# Patient Record
Sex: Female | Born: 1975 | Race: White | Hispanic: No | Marital: Married | Smoking: Current every day smoker
Health system: Southern US, Community
[De-identification: ages and names within clinical notes are randomized; demographics above are authoritative.]

## PROBLEM LIST (undated history)

## (undated) DIAGNOSIS — N390 Urinary tract infection, site not specified: Secondary | ICD-10-CM

## (undated) DIAGNOSIS — K649 Unspecified hemorrhoids: Secondary | ICD-10-CM

## (undated) DIAGNOSIS — F32A Depression, unspecified: Secondary | ICD-10-CM

## (undated) DIAGNOSIS — F329 Major depressive disorder, single episode, unspecified: Secondary | ICD-10-CM

## (undated) DIAGNOSIS — F419 Anxiety disorder, unspecified: Secondary | ICD-10-CM

## (undated) HISTORY — PX: TUBAL LIGATION: SHX77

## (undated) SURGERY — Surgical Case
Anesthesia: *Unknown

---

## 2003-11-29 ENCOUNTER — Emergency Department: Payer: Self-pay | Admitting: Emergency Medicine

## 2004-05-30 ENCOUNTER — Emergency Department: Payer: Self-pay | Admitting: Emergency Medicine

## 2004-08-14 ENCOUNTER — Emergency Department: Payer: Self-pay | Admitting: Emergency Medicine

## 2005-05-28 ENCOUNTER — Emergency Department: Payer: Self-pay | Admitting: Emergency Medicine

## 2005-07-06 ENCOUNTER — Emergency Department: Payer: Self-pay | Admitting: Unknown Physician Specialty

## 2005-12-15 ENCOUNTER — Emergency Department: Payer: Self-pay | Admitting: Internal Medicine

## 2006-11-06 ENCOUNTER — Encounter: Payer: Self-pay | Admitting: Maternal & Fetal Medicine

## 2006-11-17 ENCOUNTER — Encounter: Payer: Self-pay | Admitting: Maternal & Fetal Medicine

## 2007-01-22 HISTORY — PX: TUBAL LIGATION: SHX77

## 2007-03-10 ENCOUNTER — Observation Stay: Payer: Self-pay

## 2007-03-12 ENCOUNTER — Observation Stay: Payer: Self-pay

## 2007-03-15 ENCOUNTER — Observation Stay: Payer: Self-pay

## 2007-03-18 ENCOUNTER — Observation Stay: Payer: Self-pay

## 2007-03-20 ENCOUNTER — Ambulatory Visit: Payer: Self-pay | Admitting: Family Medicine

## 2007-03-27 ENCOUNTER — Inpatient Hospital Stay: Payer: Self-pay | Admitting: Certified Nurse Midwife

## 2007-05-15 ENCOUNTER — Ambulatory Visit: Payer: Self-pay | Admitting: Obstetrics and Gynecology

## 2007-05-18 ENCOUNTER — Ambulatory Visit: Payer: Self-pay | Admitting: Obstetrics and Gynecology

## 2007-05-27 ENCOUNTER — Emergency Department: Payer: Self-pay | Admitting: Emergency Medicine

## 2007-05-29 ENCOUNTER — Emergency Department: Payer: Self-pay | Admitting: Emergency Medicine

## 2007-06-30 ENCOUNTER — Emergency Department: Payer: Self-pay | Admitting: Emergency Medicine

## 2009-09-30 ENCOUNTER — Emergency Department: Payer: Self-pay | Admitting: Emergency Medicine

## 2010-04-24 DIAGNOSIS — F329 Major depressive disorder, single episode, unspecified: Secondary | ICD-10-CM | POA: Insufficient documentation

## 2010-07-16 ENCOUNTER — Emergency Department: Payer: Self-pay | Admitting: Emergency Medicine

## 2010-09-29 ENCOUNTER — Emergency Department: Payer: Self-pay | Admitting: Internal Medicine

## 2012-03-15 ENCOUNTER — Emergency Department: Payer: Self-pay | Admitting: Emergency Medicine

## 2012-03-16 LAB — URINALYSIS, COMPLETE
Ketone: NEGATIVE
Specific Gravity: 1.027 (ref 1.003–1.030)
WBC UR: 63 /HPF (ref 0–5)

## 2012-03-16 LAB — CBC
HCT: 37.4 % (ref 35.0–47.0)
HGB: 12.7 g/dL (ref 12.0–16.0)
MCH: 27.4 pg (ref 26.0–34.0)
MCHC: 33.9 g/dL (ref 32.0–36.0)
RBC: 4.63 10*6/uL (ref 3.80–5.20)
RDW: 16.3 % — ABNORMAL HIGH (ref 11.5–14.5)
WBC: 6.3 10*3/uL (ref 3.6–11.0)

## 2012-03-16 LAB — BASIC METABOLIC PANEL
BUN: 17 mg/dL (ref 7–18)
Chloride: 106 mmol/L (ref 98–107)
Glucose: 85 mg/dL (ref 65–99)
Osmolality: 275 (ref 275–301)
Potassium: 3.7 mmol/L (ref 3.5–5.1)
Sodium: 137 mmol/L (ref 136–145)

## 2012-05-16 ENCOUNTER — Emergency Department: Payer: Self-pay | Admitting: Emergency Medicine

## 2012-05-16 LAB — URINALYSIS, COMPLETE
Bilirubin,UR: NEGATIVE
Glucose,UR: NEGATIVE mg/dL (ref 0–75)
RBC,UR: 5 /HPF (ref 0–5)

## 2012-10-12 ENCOUNTER — Emergency Department: Payer: Self-pay | Admitting: Emergency Medicine

## 2012-10-12 LAB — URINALYSIS, COMPLETE
Bacteria: NONE SEEN
Bilirubin,UR: NEGATIVE
Glucose,UR: NEGATIVE mg/dL (ref 0–75)
Nitrite: NEGATIVE
Ph: 5 (ref 4.5–8.0)
Protein: NEGATIVE
RBC,UR: 9 /HPF (ref 0–5)
Specific Gravity: 1.014 (ref 1.003–1.030)
Transitional Epi: <1
WBC UR: 81 /HPF (ref 0–5)

## 2012-10-13 ENCOUNTER — Emergency Department: Payer: Self-pay | Admitting: Emergency Medicine

## 2012-10-13 LAB — URINALYSIS, COMPLETE
Bilirubin,UR: NEGATIVE
Glucose,UR: NEGATIVE mg/dL (ref 0–75)
Nitrite: NEGATIVE
Ph: 5 (ref 4.5–8.0)
Protein: 30
RBC,UR: 86 /HPF (ref 0–5)
Specific Gravity: 1.023 (ref 1.003–1.030)
Squamous Epithelial: 11

## 2012-10-13 LAB — COMPREHENSIVE METABOLIC PANEL
Bilirubin,Total: 0.2 mg/dL (ref 0.2–1.0)
Calcium, Total: 8.7 mg/dL (ref 8.5–10.1)
Chloride: 107 mmol/L (ref 98–107)
Co2: 25 mmol/L (ref 21–32)
Creatinine: 0.83 mg/dL (ref 0.60–1.30)
EGFR (Non-African Amer.): >60
Glucose: 122 mg/dL — ABNORMAL HIGH (ref 65–99)
SGOT(AST): 15 U/L (ref 15–37)
SGPT (ALT): 18 U/L (ref 12–78)
Sodium: 137 mmol/L (ref 136–145)

## 2012-10-13 LAB — CBC
HGB: 13.6 g/dL (ref 12.0–16.0)
Platelet: 186 10*3/uL (ref 150–440)
RBC: 4.81 10*6/uL (ref 3.80–5.20)
RDW: 16.3 % — ABNORMAL HIGH (ref 11.5–14.5)
WBC: 7.8 10*3/uL (ref 3.6–11.0)

## 2013-05-07 ENCOUNTER — Emergency Department: Payer: Self-pay | Admitting: Emergency Medicine

## 2013-05-07 LAB — URINALYSIS, COMPLETE
BLOOD: NEGATIVE
Bilirubin,UR: NEGATIVE
Glucose,UR: NEGATIVE mg/dL (ref 0–75)
Ketone: NEGATIVE
NITRITE: NEGATIVE
Ph: 7 (ref 4.5–8.0)
Protein: NEGATIVE
RBC, UR: NONE SEEN /HPF (ref 0–5)
SPECIFIC GRAVITY: 1.015 (ref 1.003–1.030)
WBC UR: 42 /HPF (ref 0–5)

## 2013-05-11 LAB — URINE CULTURE

## 2013-06-19 ENCOUNTER — Emergency Department: Payer: Self-pay | Admitting: Emergency Medicine

## 2013-06-19 LAB — CBC WITH DIFFERENTIAL/PLATELET
BASOS ABS: 0.1 10*3/uL (ref 0.0–0.1)
BASOS PCT: 0.8 %
EOS ABS: 0.2 10*3/uL (ref 0.0–0.7)
Eosinophil %: 1.9 %
HCT: 37.2 % (ref 35.0–47.0)
HGB: 12.3 g/dL (ref 12.0–16.0)
LYMPHS ABS: 1.5 10*3/uL (ref 1.0–3.6)
Lymphocyte %: 17.4 %
MCH: 27.4 pg (ref 26.0–34.0)
MCHC: 33 g/dL (ref 32.0–36.0)
MCV: 83 fL (ref 80–100)
MONO ABS: 0.8 x10 3/mm (ref 0.2–0.9)
MONOS PCT: 8.9 %
NEUTROS PCT: 71 %
Neutrophil #: 6.2 10*3/uL (ref 1.4–6.5)
Platelet: 169 10*3/uL (ref 150–440)
RBC: 4.48 10*6/uL (ref 3.80–5.20)
RDW: 16 % — ABNORMAL HIGH (ref 11.5–14.5)
WBC: 8.8 10*3/uL (ref 3.6–11.0)

## 2013-06-19 LAB — URINALYSIS, COMPLETE
BLOOD: NEGATIVE
Bacteria: NONE SEEN
Bilirubin,UR: NEGATIVE
GLUCOSE, UR: NEGATIVE mg/dL (ref 0–75)
Ketone: NEGATIVE
Nitrite: NEGATIVE
PH: 5 (ref 4.5–8.0)
Protein: NEGATIVE
RBC,UR: <1 /HPF (ref 0–5)
Specific Gravity: 1.021 (ref 1.003–1.030)
Squamous Epithelial: 5
WBC UR: 2 /HPF (ref 0–5)

## 2013-06-19 LAB — COMPREHENSIVE METABOLIC PANEL
ALBUMIN: 3.2 g/dL — AB (ref 3.4–5.0)
ALK PHOS: 71 U/L
ALT: 17 U/L (ref 12–78)
ANION GAP: 9 (ref 7–16)
AST: 8 U/L — AB (ref 15–37)
BUN: 11 mg/dL (ref 7–18)
Bilirubin,Total: 0.2 mg/dL (ref 0.2–1.0)
CHLORIDE: 106 mmol/L (ref 98–107)
CREATININE: 0.59 mg/dL — AB (ref 0.60–1.30)
Calcium, Total: 8.6 mg/dL (ref 8.5–10.1)
Co2: 22 mmol/L (ref 21–32)
EGFR (African American): >60
GLUCOSE: 108 mg/dL — AB (ref 65–99)
OSMOLALITY: 274 (ref 275–301)
Potassium: 3.7 mmol/L (ref 3.5–5.1)
Sodium: 137 mmol/L (ref 136–145)
Total Protein: 6.8 g/dL (ref 6.4–8.2)

## 2013-06-19 LAB — LIPASE, BLOOD: Lipase: 149 U/L (ref 73–393)

## 2013-06-19 LAB — TROPONIN I

## 2013-07-17 ENCOUNTER — Emergency Department: Payer: Self-pay | Admitting: Emergency Medicine

## 2013-07-17 LAB — URINALYSIS, COMPLETE
Bacteria: NONE SEEN
Bilirubin,UR: NEGATIVE
Glucose,UR: NEGATIVE mg/dL (ref 0–75)
Ketone: NEGATIVE
Nitrite: NEGATIVE
Ph: 5 (ref 4.5–8.0)
Protein: 30
RBC,UR: 180 /HPF (ref 0–5)
Specific Gravity: 1.025 (ref 1.003–1.030)
Squamous Epithelial: 4
WBC UR: 1037 /HPF (ref 0–5)

## 2013-07-22 DIAGNOSIS — N390 Urinary tract infection, site not specified: Secondary | ICD-10-CM | POA: Insufficient documentation

## 2014-02-15 ENCOUNTER — Emergency Department: Payer: Self-pay | Admitting: Emergency Medicine

## 2014-02-16 LAB — CBC
HCT: 39.3 % (ref 35.0–47.0)
HGB: 12.9 g/dL (ref 12.0–16.0)
MCH: 26.9 pg (ref 26.0–34.0)
MCHC: 32.9 g/dL (ref 32.0–36.0)
MCV: 82 fL (ref 80–100)
Platelet: 228 10*3/uL (ref 150–440)
RBC: 4.79 10*6/uL (ref 3.80–5.20)
RDW: 15.9 % — ABNORMAL HIGH (ref 11.5–14.5)
WBC: 7.3 10*3/uL (ref 3.6–11.0)

## 2014-02-16 LAB — HCG, QUANTITATIVE, PREGNANCY

## 2014-02-16 LAB — GC/CHLAMYDIA PROBE AMP

## 2014-02-16 LAB — WET PREP, GENITAL

## 2014-03-20 ENCOUNTER — Emergency Department: Payer: Self-pay | Admitting: Internal Medicine

## 2014-05-02 ENCOUNTER — Emergency Department: Admit: 2014-05-02 | Disposition: A | Discharge status: Home / Self Care | Payer: Self-pay | Admitting: Student

## 2014-05-02 LAB — URINALYSIS, COMPLETE
BACTERIA: NONE SEEN
BILIRUBIN, UR: NEGATIVE
BLOOD: NEGATIVE
Glucose,UR: NEGATIVE mg/dL (ref 0–75)
Ketone: NEGATIVE
NITRITE: NEGATIVE
PH: 5 (ref 4.5–8.0)
Protein: NEGATIVE
Specific Gravity: 1.025 (ref 1.003–1.030)

## 2014-05-02 LAB — GC/CHLAMYDIA PROBE AMP

## 2014-05-02 LAB — WET PREP, GENITAL

## 2014-05-04 LAB — URINE CULTURE

## 2014-06-10 ENCOUNTER — Encounter: Payer: Self-pay | Admitting: Emergency Medicine

## 2014-06-10 ENCOUNTER — Emergency Department
Admission: EM | Admit: 2014-06-10 | Discharge: 2014-06-10 | Disposition: A | Discharge status: Home / Self Care | Payer: BLUE CROSS/BLUE SHIELD | Attending: Emergency Medicine | Admitting: Emergency Medicine

## 2014-06-10 DIAGNOSIS — Z720 Tobacco use: Secondary | ICD-10-CM | POA: Diagnosis not present

## 2014-06-10 DIAGNOSIS — Z79899 Other long term (current) drug therapy: Secondary | ICD-10-CM | POA: Diagnosis not present

## 2014-06-10 DIAGNOSIS — K625 Hemorrhage of anus and rectum: Secondary | ICD-10-CM | POA: Insufficient documentation

## 2014-06-10 DIAGNOSIS — R1030 Lower abdominal pain, unspecified: Secondary | ICD-10-CM

## 2014-06-10 HISTORY — DX: Anxiety disorder, unspecified: F41.9

## 2014-06-10 HISTORY — DX: Unspecified hemorrhoids: K64.9

## 2014-06-10 HISTORY — DX: Major depressive disorder, single episode, unspecified: F32.9

## 2014-06-10 HISTORY — DX: Depression, unspecified: F32.A

## 2014-06-10 LAB — COMPREHENSIVE METABOLIC PANEL
ALT: 15 U/L (ref 14–54)
AST: 15 U/L (ref 15–41)
Albumin: 3.8 g/dL (ref 3.5–5.0)
Alkaline Phosphatase: 57 U/L (ref 38–126)
Anion gap: 8 (ref 5–15)
BUN: 11 mg/dL (ref 6–20)
CALCIUM: 8.7 mg/dL — AB (ref 8.9–10.3)
CHLORIDE: 102 mmol/L (ref 101–111)
CO2: 26 mmol/L (ref 22–32)
Creatinine, Ser: 0.63 mg/dL (ref 0.44–1.00)
GFR calc non Af Amer: >60 mL/min (ref >60)
Glucose, Bld: 90 mg/dL (ref 65–99)
POTASSIUM: 3.8 mmol/L (ref 3.5–5.1)
Sodium: 136 mmol/L (ref 135–145)
TOTAL PROTEIN: 7 g/dL (ref 6.5–8.1)
Total Bilirubin: 0.1 mg/dL — ABNORMAL LOW (ref 0.3–1.2)

## 2014-06-10 LAB — CBC
HCT: 37.7 % (ref 35.0–47.0)
Hemoglobin: 12.8 g/dL (ref 12.0–16.0)
MCH: 27.6 pg (ref 26.0–34.0)
MCHC: 34.1 g/dL (ref 32.0–36.0)
MCV: 80.9 fL (ref 80.0–100.0)
Platelets: 186 10*3/uL (ref 150–440)
RBC: 4.65 MIL/uL (ref 3.80–5.20)
RDW: 16.5 % — ABNORMAL HIGH (ref 11.5–14.5)
WBC: 8 10*3/uL (ref 3.6–11.0)

## 2014-06-10 MED ORDER — METRONIDAZOLE 500 MG PO TABS: 500.0000 mg | ORAL_TABLET | Freq: Two times a day (BID) | ORAL | Status: AC

## 2014-06-10 MED ORDER — METRONIDAZOLE 500 MG PO TABS
500.0000 mg | ORAL_TABLET | Freq: Once | ORAL | Status: AC
  Administered 2014-06-10: 500 mg via ORAL

## 2014-06-10 MED ORDER — METRONIDAZOLE 500 MG PO TABS
ORAL_TABLET | ORAL | Status: DC
Start: 2014-06-10 — End: 2014-06-11
  Filled 2014-06-10: qty 1

## 2014-06-10 MED ORDER — CIPROFLOXACIN HCL 250 MG PO TABS: 250.0000 mg | ORAL_TABLET | Freq: Two times a day (BID) | ORAL | Status: AC

## 2014-06-10 MED ORDER — CIPROFLOXACIN HCL 500 MG PO TABS
ORAL_TABLET | ORAL | Status: DC
Start: 2014-06-10 — End: 2014-06-11
  Filled 2014-06-10: qty 1

## 2014-06-10 MED ORDER — CIPROFLOXACIN HCL 500 MG PO TABS
250.0000 mg | ORAL_TABLET | Freq: Once | ORAL | Status: AC
Start: 2014-06-10 — End: 2014-06-10
  Administered 2014-06-10: 250 mg via ORAL

## 2014-06-10 NOTE — Discharge Instructions (Signed)
Take metronidazole and Cipro for a possible diverticulitis. The agreed not to do any CT or imaging for this with your current symptoms and white blood cell count. Follow-up with her regular doctor. Return to the emergency department if your symptoms worse or you have uncontrolled pain.  Abdominal Pain Many things can cause belly (abdominal) pain. Most times, the belly pain is not dangerous. Many cases of belly pain can be watched and treated at home. HOME CARE   Do not take medicines that help you go poop (laxatives) unless told to by your doctor.  Only take medicine as told by your doctor.  Eat or drink as told by your doctor. Your doctor will tell you if you should be on a special diet. GET HELP IF:  You do not know what is causing your belly pain.  You have belly pain while you are sick to your stomach (nauseous) or have runny poop (diarrhea).  You have pain while you pee or poop.  Your belly pain wakes you up at night.  You have belly pain that gets worse or better when you eat.  You have belly pain that gets worse when you eat fatty foods.  You have a fever. GET HELP RIGHT AWAY IF:   The pain does not go away within 2 hours.  You keep throwing up (vomiting).  The pain changes and is only in the right or left part of the belly.  You have bloody or tarry looking poop. MAKE SURE YOU:   Understand these instructions.  Will watch your condition.  Will get help right away if you are not doing well or get worse. Document Released: 06/26/2007 Document Revised: 01/12/2013 Document Reviewed: 09/16/2012 [facility] Patient Information 2015 [employer] [place]. This information is not intended to replace advice given to you by your health care provider. Make sure you discuss any questions you have with your health care provider.

## 2014-06-10 NOTE — ED Notes (Signed)
Patient states she had BM on Wednesday and noticed blood in toilet (more than just blood on paper). Patient states she had BM again today with the same. Has h/o hemorrhoids. +Pain with Wednesday BM, no pain today.

## 2014-06-10 NOTE — ED Notes (Signed)
Patient states she had diarrhea, nausea and vomiting Wednesday night which has now resolved. Patient states she saw blood in her stool at that time. Patient reports that today she had a regular, soft BM and noticed blood today also.

## 2014-06-10 NOTE — ED Provider Notes (Signed)
[facility] Emergency Department Provider Note  ____________________________________________  Time seen: 2035  I have reviewed the triage vital signs and the nursing notes.   HISTORY  Chief Complaint Rectal Bleeding  Abdominal pain   HPI Debra Wang is a 39 y.o. female who reports she has had some diarrhea this week. On Wednesday she had a bowel movement that had some blood present. There was blood in the toilet and little bit on the toilet paper. She had no pain at that time. She has taken some Pepto-Bismol to treat the diarrhea. Her stool has firmed up some. She says she had a normal bowel movement today but again had blood present. She reports she has had some pain in her left lower quadrant. She does have a history of hemorrhoids. With hemorrhoids in the past she had had rectal pain which she does not have currently. She denies any fever. She has had a little bit of nausea earlier in the week but no vomiting.  Past Medical History  Diagnosis Date  . Anxiety   . Depression   . Hemorrhoid     There are no active problems to display for this patient.   Past Surgical History  Procedure Laterality Date  . Tubal ligation  2009    Current Outpatient Rx  Name  Route  Sig  Dispense  Refill  . citalopram (CELEXA) 40 MG tablet   Oral   Take 40 mg by mouth daily.         . clonazePAM (KLONOPIN) 0.5 MG tablet   Oral   Take 0.5 mg by mouth at bedtime.         . traZODone (DESYREL) 50 MG tablet   Oral   Take 50 mg by mouth at bedtime. prn         . ciprofloxacin (CIPRO) 250 MG tablet   Oral   Take 1 tablet (250 mg total) by mouth 2 (two) times daily.   14 tablet   0   . metroNIDAZOLE (FLAGYL) 500 MG tablet   Oral   Take 1 tablet (500 mg total) by mouth 2 (two) times daily.   14 tablet   0     Allergies Sulfa antibiotics  No family history on file.  Social History History  Substance Use Topics  . Smoking status: Current Every  Day Smoker -- 0.50 packs/day    Types: Cigarettes  . Smokeless tobacco: Never Used  . Alcohol Use: No    Review of Systems  Constitutional: Negative for fever. ENT: Negative for sore throat. Cardiovascular: Negative for chest pain. Respiratory: Negative for shortness of breath. Gastrointestinal: Notable for diarrhea earlier in the week with rectal bleeding. See history of present illness Genitourinary: Negative for dysuria. Musculoskeletal: Negative for back pain. Skin: Negative for rash. Neurological: Negative for headaches   10-point ROS otherwise negative.  ____________________________________________   PHYSICAL EXAM:  VITAL SIGNS: ED Triage Vitals  Enc Vitals Group     BP 06/10/14 1557 135/91 mmHg     Pulse Rate 06/10/14 1557 84     Resp 06/10/14 2039 18     Temp 06/10/14 1557 97.6 F (36.4 C)     Temp Source 06/10/14 1557 Oral     SpO2 06/10/14 1557 99 %     Weight 06/10/14 1557 175 lb (79.379 kg)     Height 06/10/14 1557 5\' 5"  (1.651 m)     Head Cir --      Peak Flow --  Pain Score --      Pain Loc --      Pain Edu? --      Excl. in GC? --     Constitutional: Alert and oriented. Well appearing and in no distress. ENT   Head: Normocephalic and atraumatic.   Nose: No congestion/rhinnorhea.   Mouth/Throat: Mucous membranes are moist. Cardiovascular: Normal rate, regular rhythm. Respiratory: Normal respiratory effort without tachypnea. Breath sounds are clear and equal bilaterally. No wheezes/rales/rhonchi. Gastrointestinal: Soft. There is mild tenderness in the left lower quadrant.  Rectal exam: Normal anal/rectal exam. No sign of hemorrhoids. No tenderness. No melena or bright red blood. Back: There is no CVA tenderness. Musculoskeletal: Nontender with normal range of motion in all extremities.  No noted edema. Neurologic:  Normal speech and language. No gross focal neurologic deficits are appreciated.  Skin:  Skin is warm, dry. No rash  noted. Psychiatric: Mood and affect are normal. Speech and behavior are normal.  ____________________________________________    LABS (pertinent positives/negatives)  Blood cell count of 8.0 hemoglobin of 12.8. Metabolic panel is within normal limits.  ____________________________________________   INITIAL IMPRESSION / ASSESSMENT AND PLAN / ED COURSE  I do not see evidence of a hemorrhoid. The patient has mild pain left lower quadrant with a normal white blood cell count. We discussed the pros and cons of a CT scan of the patient agrees that a CT would not be appropriate for her at this time. She agrees to use antibiotics currently for possible diverticulitis and [facility].  ____________________________________________   FINAL CLINICAL IMPRESSION(S) / ED DIAGNOSES  Final diagnoses:  Rectal bleeding  Lower abdominal pain      Darien Ramusavid W Tyson Parkison, MD 06/10/14 2102

## 2014-06-10 NOTE — ED Notes (Signed)
Dr. Carollee MassedKaminski at bedside, performed rectal exam with this writer present.

## 2014-12-24 ENCOUNTER — Emergency Department
Admission: EM | Admit: 2014-12-24 | Discharge: 2014-12-24 | Disposition: A | Discharge status: Home / Self Care | Payer: BLUE CROSS/BLUE SHIELD | Attending: Emergency Medicine | Admitting: Emergency Medicine

## 2014-12-24 DIAGNOSIS — F17210 Nicotine dependence, cigarettes, uncomplicated: Secondary | ICD-10-CM | POA: Insufficient documentation

## 2014-12-24 DIAGNOSIS — Z3202 Encounter for pregnancy test, result negative: Secondary | ICD-10-CM | POA: Insufficient documentation

## 2014-12-24 DIAGNOSIS — N390 Urinary tract infection, site not specified: Secondary | ICD-10-CM | POA: Diagnosis not present

## 2014-12-24 DIAGNOSIS — R109 Unspecified abdominal pain: Secondary | ICD-10-CM | POA: Diagnosis present

## 2014-12-24 DIAGNOSIS — Z79899 Other long term (current) drug therapy: Secondary | ICD-10-CM | POA: Insufficient documentation

## 2014-12-24 DIAGNOSIS — F419 Anxiety disorder, unspecified: Secondary | ICD-10-CM | POA: Insufficient documentation

## 2014-12-24 DIAGNOSIS — F329 Major depressive disorder, single episode, unspecified: Secondary | ICD-10-CM | POA: Insufficient documentation

## 2014-12-24 LAB — URINALYSIS COMPLETE WITH MICROSCOPIC (ARMC ONLY)
Bilirubin Urine: NEGATIVE
Glucose, UA: NEGATIVE mg/dL
Ketones, ur: NEGATIVE mg/dL
Nitrite: NEGATIVE
PH: 5 (ref 5.0–8.0)
PROTEIN: NEGATIVE mg/dL
Specific Gravity, Urine: 1.016 (ref 1.005–1.030)

## 2014-12-24 LAB — POCT PREGNANCY, URINE: PREG TEST UR: NEGATIVE

## 2014-12-24 MED ORDER — TRAMADOL HCL 50 MG PO TABS
50.0000 mg | ORAL_TABLET | Freq: Once | ORAL | Status: AC
  Administered 2014-12-24: 50 mg via ORAL
  Filled 2014-12-24: qty 1

## 2014-12-24 MED ORDER — CEPHALEXIN 500 MG PO CAPS
500.0000 mg | ORAL_CAPSULE | Freq: Once | ORAL | Status: AC
  Administered 2014-12-24: 500 mg via ORAL
  Filled 2014-12-24: qty 1

## 2014-12-24 MED ORDER — CEPHALEXIN 500 MG PO CAPS: 500.0000 mg | ORAL_CAPSULE | Freq: Four times a day (QID) | ORAL | Status: DC

## 2014-12-24 MED ORDER — ONDANSETRON 8 MG PO TBDP
8.0000 mg | ORAL_TABLET | Freq: Once | ORAL | Status: AC
  Administered 2014-12-24: 8 mg via ORAL
  Filled 2014-12-24: qty 1

## 2014-12-24 MED ORDER — PHENAZOPYRIDINE HCL 200 MG PO TABS
200.0000 mg | ORAL_TABLET | Freq: Once | ORAL | Status: AC
  Administered 2014-12-24: 200 mg via ORAL
  Filled 2014-12-24: qty 1

## 2014-12-24 MED ORDER — PHENAZOPYRIDINE HCL 200 MG PO TABS: 200.0000 mg | ORAL_TABLET | Freq: Three times a day (TID) | ORAL | Status: DC | PRN

## 2014-12-24 MED ORDER — TRAMADOL HCL 50 MG PO TABS: 50.0000 mg | ORAL_TABLET | Freq: Four times a day (QID) | ORAL | Status: DC | PRN

## 2014-12-24 NOTE — ED Provider Notes (Signed)
[facility] Emergency Department Provider Note  ____________________________________________  Time seen: Approximately 9:35 PM  I have reviewed the triage vital signs and the nursing notes.   HISTORY  Chief Complaint Flank Pain    HPI Debra Wang is a 39 y.o. female patient complaining of 2 days of frequency and left flank pain. She has a history of UTI needs complaints of similar to her past problems. Patient denies any vaginal discharge or fever. No palliative measures taken for this complaint.Patient rates the pain as 8/10. Describes the pain as a burning sensation on urination.   Past Medical History  Diagnosis Date  . Anxiety   . Depression   . Hemorrhoid     There are no active problems to display for this patient.   Past Surgical History  Procedure Laterality Date  . Tubal ligation  2009    Current Outpatient Rx  Name  Route  Sig  Dispense  Refill  . cephALEXin (KEFLEX) 500 MG capsule   Oral   Take 1 capsule (500 mg total) by mouth 4 (four) times daily.   40 capsule   0   . citalopram (CELEXA) 40 MG tablet   Oral   Take 40 mg by mouth daily.         . clonazePAM (KLONOPIN) 0.5 MG tablet   Oral   Take 0.5 mg by mouth at bedtime.         . phenazopyridine (PYRIDIUM) 200 MG tablet   Oral   Take 1 tablet (200 mg total) by mouth 3 (three) times daily as needed for pain.   6 tablet   0   . traMADol (ULTRAM) 50 MG tablet   Oral   Take 1 tablet (50 mg total) by mouth every 6 (six) hours as needed for moderate pain.   12 tablet   0   . traZODone (DESYREL) 50 MG tablet   Oral   Take 50 mg by mouth at bedtime. prn           Allergies Sulfa antibiotics  No family history on file.  Social History Social History  Substance Use Topics  . Smoking status: Current Every Day Smoker -- 0.50 packs/day    Types: Cigarettes  . Smokeless tobacco: Never Used  . Alcohol Use: No    Review of Systems Constitutional: No  fever/chills Eyes: No visual changes. ENT: No sore throat. Cardiovascular: Denies chest pain. Respiratory: Denies shortness of breath. Gastrointestinal: No abdominal pain.  No nausea, no vomiting.  No diarrhea.  No constipation. Genitourinary: Negative for dysuria. Musculoskeletal: Negative for back pain. Skin: Negative for rash. Neurological: Negative for headaches, focal weakness or numbness. Psychiatric: Anxiety and depression Hematological/Lymphatic: Allergic/Immunilogical: Sulfa antibiotics  10-point ROS otherwise negative.  ____________________________________________   PHYSICAL EXAM:  VITAL SIGNS: ED Triage Vitals  Enc Vitals Group     BP 12/24/14 2023 123/87 mmHg     Pulse Rate 12/24/14 2023 81     Resp 12/24/14 2023 16     Temp 12/24/14 2023 98.6 F (37 C)     Temp Source 12/24/14 2023 Oral     SpO2 12/24/14 2023 99 %     Weight 12/24/14 2023 160 lb (72.576 kg)     Height 12/24/14 2023  (1.651 m)     Head Cir --      Peak Flow --      Pain Score 12/24/14 2023 8     Pain Loc --  Pain Edu? --      Excl. in GC? --    Constitutional: Alert and oriented. Well appearing and in no acute distress. Eyes: Conjunctivae are normal. PERRL. EOMI. Head: Atraumatic. Nose: No congestion/rhinnorhea. Mouth/Throat: Mucous membranes are moist.  Oropharynx non-erythematous. Neck: No stridor. No cervical spine tenderness to palpation. Hematological/Lymphatic/Immunilogical: No cervical lymphadenopathy. Cardiovascular: Normal rate, regular rhythm. Grossly normal heart sounds.  Good peripheral circulation. Respiratory: Normal respiratory effort.  No retractions. Lungs CTAB. Gastrointestinal: Soft and nontender. No distention. No abdominal bruits. No CVA tenderness. Genitourinary: Deferred Musculoskeletal: No lower extremity tenderness nor edema.  No joint effusions. Neurologic:  Normal speech and language. No gross focal neurologic deficits are appreciated. No gait  instability. Skin:  Skin is warm, dry and intact. No rash noted. Psychiatric: Mood and affect are normal. Speech and behavior are normal.  ____________________________________________   LABS (all labs ordered are listed, but only abnormal results are displayed)  Labs Reviewed  URINALYSIS COMPLETEWITH MICROSCOPIC ([facility] ONLY) - Abnormal; Notable for the following:    Color, Urine YELLOW (*)    APPearance CLOUDY (*)    Hgb urine dipstick 1+ (*)    Leukocytes, UA 3+ (*)    Bacteria, UA RARE (*)    Squamous Epithelial / LPF 6-30 (*)    All other components within normal limits  POCT PREGNANCY, URINE  POC URINE PREG, ED   ____________________________________________  EKG   ____________________________________________  RADIOLOGY   ____________________________________________   PROCEDURES  Procedure(s) performed: None  Critical Care performed: No  ____________________________________________   INITIAL IMPRESSION / ASSESSMENT AND PLAN / ED COURSE  Pertinent labs & imaging results that were available during my care of the patient were reviewed by me and considered in my medical decision making (see chart for details).  Urinary tract infection. Discussed discharge care instructions patient. Patient given a prescription for Keflex, Pyridium, and tramadol. Patient advised follow-up with her family doctor 10 days to retest urine ____________________________________________   FINAL CLINICAL IMPRESSION(S) / ED DIAGNOSES  Final diagnoses:  UTI (lower urinary tract infection)      Joni Reiningonald K Smith, PA-C 12/24/14 2150  Myrna Blazeravid Matthew Schaevitz, MD 12/24/14 2322

## 2014-12-24 NOTE — ED Notes (Signed)
Pt c/o urinary frequency and left flank pain since yesterday. Reports hx of UTI with similar symptoms in past.

## 2015-06-18 ENCOUNTER — Encounter: Payer: Self-pay | Admitting: Emergency Medicine

## 2015-06-18 ENCOUNTER — Emergency Department
Admission: EM | Admit: 2015-06-18 | Discharge: 2015-06-18 | Disposition: A | Discharge status: Home / Self Care | Payer: BLUE CROSS/BLUE SHIELD | Attending: Emergency Medicine | Admitting: Emergency Medicine

## 2015-06-18 DIAGNOSIS — F17210 Nicotine dependence, cigarettes, uncomplicated: Secondary | ICD-10-CM | POA: Insufficient documentation

## 2015-06-18 DIAGNOSIS — F329 Major depressive disorder, single episode, unspecified: Secondary | ICD-10-CM | POA: Insufficient documentation

## 2015-06-18 DIAGNOSIS — J0120 Acute ethmoidal sinusitis, unspecified: Secondary | ICD-10-CM | POA: Insufficient documentation

## 2015-06-18 DIAGNOSIS — J029 Acute pharyngitis, unspecified: Secondary | ICD-10-CM | POA: Diagnosis present

## 2015-06-18 MED ORDER — AMOXICILLIN-POT CLAVULANATE 875-125 MG PO TABS: 1.0000 | ORAL_TABLET | Freq: Two times a day (BID) | ORAL | Status: DC

## 2015-06-18 MED ORDER — FLUTICASONE PROPIONATE 50 MCG/ACT NA SUSP: 1.0000 | Freq: Two times a day (BID) | NASAL | Status: DC

## 2015-06-18 MED ORDER — CETIRIZINE HCL 10 MG PO TABS: 10.0000 mg | ORAL_TABLET | Freq: Every day | ORAL | Status: DC

## 2015-06-18 NOTE — ED Provider Notes (Signed)
[facility] Emergency Department Provider Note  ____________________________________________  Time seen: Approximately 9:57 PM  I have reviewed the triage vital signs and the nursing notes.   HISTORY  Chief Complaint Sore Throat and Generalized Body Aches    HPI Debra Wang is a 40 y.o. female who presents to emergency department complaining of nasal congestion, sore throat, cough, nausea, general myalgias. Patient states that symptoms began insidiously and has been ongoing 2 days. Patient denies any headache, visual changes, neck pain, chest pain, shortness breath, emesis, abdominal pain, diarrhea or constipation. Patient denies fevers or chills. She is able to maintain good oral intake of fluids and solids. She has not tried any medications for these symptoms prior to arrival.   Past Medical History  Diagnosis Date  . Anxiety   . Depression   . Hemorrhoid   . Anxiety   . Depression     There are no active problems to display for this patient.   Past Surgical History  Procedure Laterality Date  . Tubal ligation  2009  . Tubal ligation      Current Outpatient Rx  Name  Route  Sig  Dispense  Refill  . amoxicillin-clavulanate (AUGMENTIN) 875-125 MG tablet   Oral   Take 1 tablet by mouth 2 (two) times daily.   14 tablet   0   . cephALEXin (KEFLEX) 500 MG capsule   Oral   Take 1 capsule (500 mg total) by mouth 4 (four) times daily.   40 capsule   0   . cetirizine (ZYRTEC) 10 MG tablet   Oral   Take 1 tablet (10 mg total) by mouth daily.   30 tablet   0   . citalopram (CELEXA) 40 MG tablet   Oral   Take 40 mg by mouth daily.         . clonazePAM (KLONOPIN) 0.5 MG tablet   Oral   Take 0.5 mg by mouth at bedtime.         . fluticasone (FLONASE) 50 MCG/ACT nasal spray   Each Nare   Place 1 spray into both nostrils 2 (two) times daily.   16 g   0   . phenazopyridine (PYRIDIUM) 200 MG tablet   Oral   Take 1 tablet (200 mg  total) by mouth 3 (three) times daily as needed for pain.   6 tablet   0   . traMADol (ULTRAM) 50 MG tablet   Oral   Take 1 tablet (50 mg total) by mouth every 6 (six) hours as needed for moderate pain.   12 tablet   0   . traZODone (DESYREL) 50 MG tablet   Oral   Take 50 mg by mouth at bedtime. prn           Allergies Benadryl and Sulfa antibiotics  History reviewed. No pertinent family history.  Social History Social History  Substance Use Topics  . Smoking status: Current Every Day Smoker -- 0.50 packs/day    Types: Cigarettes  . Smokeless tobacco: Never Used  . Alcohol Use: No     Review of Systems  Constitutional: No fever/chills Eyes: No visual changes. No discharge ENT: Positive for nasal congestion. Positive for sore throat. Denies ear pain. Cardiovascular: no chest pain. Respiratory: Positive cough. No SOB. Gastrointestinal: No abdominal pain.  Positive for nausea but no vomiting.  No diarrhea.  No constipation. Musculoskeletal: Endorses generalized muscle aches. Skin: Negative for rash, abrasions, lacerations, ecchymosis. Neurological: Negative for headaches,  focal weakness or numbness. 10-point ROS otherwise negative.  ____________________________________________   PHYSICAL EXAM:  VITAL SIGNS: ED Triage Vitals  Enc Vitals Group     BP 06/18/15 2152 138/77 mmHg     Pulse Rate 06/18/15 2152 93     Resp 06/18/15 2150 14     Temp 06/18/15 2152 98.2 F (36.8 C)     Temp Source 06/18/15 2152 Oral     SpO2 06/18/15 2152 100 %     Weight 06/18/15 2146 170 lb (77.111 kg)     Height 06/18/15 2146  (1.651 m)     Head Cir --      Peak Flow --      Pain Score 06/18/15 2147 9     Pain Loc --      Pain Edu? --      Excl. in GC? --      Constitutional: Alert and oriented. Well appearing and in no acute distress. Eyes: Conjunctivae are normal. PERRL. EOMI. Head: Atraumatic. ENT:      Ears: EACs and TMs are unremarkable bilaterally.      Nose:  Positive for purulent nasal congestion. Turbinates are erythematous and edematous. Tenderness to percussion of the ethmoid sinuses.      Mouth/Throat: Mucous membranes are moist. Oropharynx is mildly erythematous but nonedematous. Uvula is midline. Postnasal drip identified. Neck: No stridor.   Hematological/Lymphatic/Immunilogical: No cervical lymphadenopathy. Cardiovascular: Normal rate, regular rhythm. Normal S1 and S2.  Good peripheral circulation. Respiratory: Normal respiratory effort without tachypnea or retractions. Lungs CTAB. Good air entry to the bases with no decreased or absent breath sounds. Gastrointestinal: Bowel sounds 4 quadrants. Soft and nontender to palpation. No guarding or rigidity. No palpable masses. No distention.  Musculoskeletal: Full range of motion to all extremities. No gross deformities appreciated. Neurologic:  Normal speech and language. No gross focal neurologic deficits are appreciated.  Skin:  Skin is warm, dry and intact. No rash noted. Psychiatric: Mood and affect are normal. Speech and behavior are normal. Patient exhibits appropriate insight and judgement.   ____________________________________________   LABS (all labs ordered are listed, but only abnormal results are displayed)  Labs Reviewed - No data to display ____________________________________________  EKG   ____________________________________________  RADIOLOGY   No results found.  ____________________________________________    PROCEDURES  Procedure(s) performed:       Medications - No data to display   ____________________________________________   INITIAL IMPRESSION / ASSESSMENT AND PLAN / ED COURSE  Pertinent labs & imaging results that were available during my care of the patient were reviewed by me and considered in my medical decision making (see chart for details).  Patient's diagnosis is consistent with ethmoid sinusitis. Patient will be discharged home  with prescriptions for antibiotics, flonase, and cetirizine. Patient is to follow up with primary care provider as needed or otherwise directed. Patient is given ED precautions to return to the ED for any worsening or new symptoms.     ____________________________________________  FINAL CLINICAL IMPRESSION(S) / ED DIAGNOSES  Final diagnoses:  Acute ethmoidal sinusitis, recurrence not specified      NEW MEDICATIONS STARTED DURING THIS VISIT:  New Prescriptions   AMOXICILLIN-CLAVULANATE (AUGMENTIN) 875-125 MG TABLET    Take 1 tablet by mouth 2 (two) times daily.   CETIRIZINE (ZYRTEC) 10 MG TABLET    Take 1 tablet (10 mg total) by mouth daily.   FLUTICASONE (FLONASE) 50 MCG/ACT NASAL SPRAY    Place 1 spray into both nostrils 2 (two) times  daily.        This chart was dictated using voice recognition software/Dragon. Despite best efforts to proofread, errors can occur which can change the meaning. Any change was purely unintentional.    Racheal Patches, PA-C 06/18/15 2216  Myrna Blazer, MD 06/18/15 [phone]

## 2015-06-18 NOTE — Discharge Instructions (Signed)

## 2015-06-18 NOTE — ED Notes (Addendum)
Pt c/o generalized body aches with cough and nausea. Pt has been taking tylenol with no relief Denies fevers at home.

## 2015-06-18 NOTE — ED Notes (Signed)
Pt states sore throat, generazlied body aches since yesterday. Pt states "i feel like i have a huge ball of snot in my throat." pt appears in no acute distress, ambulatory without difficulty.

## 2015-07-09 ENCOUNTER — Encounter: Payer: Self-pay | Admitting: Emergency Medicine

## 2015-07-09 ENCOUNTER — Emergency Department
Admission: EM | Admit: 2015-07-09 | Discharge: 2015-07-09 | Disposition: A | Discharge status: Home / Self Care | Payer: BLUE CROSS/BLUE SHIELD | Attending: Emergency Medicine | Admitting: Emergency Medicine

## 2015-07-09 DIAGNOSIS — Z7951 Long term (current) use of inhaled steroids: Secondary | ICD-10-CM | POA: Insufficient documentation

## 2015-07-09 DIAGNOSIS — F17210 Nicotine dependence, cigarettes, uncomplicated: Secondary | ICD-10-CM | POA: Insufficient documentation

## 2015-07-09 DIAGNOSIS — R300 Dysuria: Secondary | ICD-10-CM | POA: Diagnosis present

## 2015-07-09 DIAGNOSIS — N390 Urinary tract infection, site not specified: Secondary | ICD-10-CM | POA: Diagnosis not present

## 2015-07-09 DIAGNOSIS — Z79899 Other long term (current) drug therapy: Secondary | ICD-10-CM | POA: Diagnosis not present

## 2015-07-09 DIAGNOSIS — F329 Major depressive disorder, single episode, unspecified: Secondary | ICD-10-CM | POA: Diagnosis not present

## 2015-07-09 HISTORY — DX: Urinary tract infection, site not specified: N39.0

## 2015-07-09 LAB — URINALYSIS COMPLETE WITH MICROSCOPIC (ARMC ONLY): Specific Gravity, Urine: 1.015 (ref 1.005–1.030)

## 2015-07-09 MED ORDER — CIPROFLOXACIN HCL 500 MG PO TABS
500.0000 mg | ORAL_TABLET | Freq: Once | ORAL | Status: AC
  Administered 2015-07-09: 500 mg via ORAL
  Filled 2015-07-09: qty 1

## 2015-07-09 MED ORDER — CIPROFLOXACIN HCL 500 MG PO TABS
500.0000 mg | ORAL_TABLET | Freq: Two times a day (BID) | ORAL | Status: DC
Start: 2015-07-09 — End: 2015-08-02

## 2015-07-09 NOTE — Discharge Instructions (Signed)

## 2015-07-09 NOTE — ED Notes (Signed)
Pt states has had dysuria since Thursday. Pt states history of uti. Pt denies fever. Pt appears in no acute distress in triage.

## 2015-07-09 NOTE — ED Provider Notes (Signed)
[facility] Emergency Department Provider Note  ____________________________________________  Time seen: Approximately 11:19 PM  I have reviewed the triage vital signs and the nursing notes.   HISTORY  Chief Complaint Dysuria    HPI Debra Wang is a 40 y.o. female who presents emergency department complaining of urinary tract symptoms to include dysuria, polyuria. Patient states that she has a history of urinary tract infections and this is consistent with previous infections. Patient denies any flank pain, hematuria, fevers or chills, nausea or vomiting. Patient does endorse some moderate suprapubic pain. Patient denies any vaginal discharge or bleeding. Patient has used Azo prior to arrival. Patient states that dysuria is described as a burning sensation. Pain is moderate to severe. Unrelieved by Azo.   Past Medical History  Diagnosis Date  . Anxiety   . Depression   . Hemorrhoid   . Anxiety   . Depression   . UTI (lower urinary tract infection)     There are no active problems to display for this patient.   Past Surgical History  Procedure Laterality Date  . Tubal ligation  2009  . Tubal ligation      Current Outpatient Rx  Name  Route  Sig  Dispense  Refill  . amoxicillin-clavulanate (AUGMENTIN) 875-125 MG tablet   Oral   Take 1 tablet by mouth 2 (two) times daily.   14 tablet   0   . cephALEXin (KEFLEX) 500 MG capsule   Oral   Take 1 capsule (500 mg total) by mouth 4 (four) times daily.   40 capsule   0   . cetirizine (ZYRTEC) 10 MG tablet   Oral   Take 1 tablet (10 mg total) by mouth daily.   30 tablet   0   . ciprofloxacin (CIPRO) 500 MG tablet   Oral   Take 1 tablet (500 mg total) by mouth 2 (two) times daily.   10 tablet   0   . citalopram (CELEXA) 40 MG tablet   Oral   Take 40 mg by mouth daily.         . clonazePAM (KLONOPIN) 0.5 MG tablet   Oral   Take 0.5 mg by mouth at bedtime.         . fluticasone  (FLONASE) 50 MCG/ACT nasal spray   Each Nare   Place 1 spray into both nostrils 2 (two) times daily.   16 g   0   . phenazopyridine (PYRIDIUM) 200 MG tablet   Oral   Take 1 tablet (200 mg total) by mouth 3 (three) times daily as needed for pain.   6 tablet   0   . traMADol (ULTRAM) 50 MG tablet   Oral   Take 1 tablet (50 mg total) by mouth every 6 (six) hours as needed for moderate pain.   12 tablet   0   . traZODone (DESYREL) 50 MG tablet   Oral   Take 50 mg by mouth at bedtime. prn           Allergies Benadryl and Sulfa antibiotics  No family history on file.  Social History Social History  Substance Use Topics  . Smoking status: Current Every Day Smoker -- 0.50 packs/day    Types: Cigarettes  . Smokeless tobacco: Never Used  . Alcohol Use: No     Review of Systems  Constitutional: No fever/chills Cardiovascular: no chest pain. Respiratory: no cough. No SOB. Gastrointestinal: Positive for suprapubic pain.  No nausea, no  vomiting.  No diarrhea.  No constipation. Genitourinary: As above for dysuria and polyuria. No hematuria Musculoskeletal: Negative for musculoskeletal pain. Skin: Negative for rash, abrasions, lacerations, ecchymosis. Neurological: Negative for headaches, focal weakness or numbness. 10-point ROS otherwise negative.  ____________________________________________   PHYSICAL EXAM:  VITAL SIGNS: ED Triage Vitals  Enc Vitals Group     BP 07/09/15 2148 132/85 mmHg     Pulse Rate 07/09/15 2148 76     Resp 07/09/15 2148 18     Temp 07/09/15 2148 98.1 F (36.7 C)     Temp Source 07/09/15 2148 Oral     SpO2 07/09/15 2148 97 %     Weight 07/09/15 2148 165 lb (74.844 kg)     Height 07/09/15 2148 5\' 5"  (1.651 m)     Head Cir --      Peak Flow --      Pain Score 07/09/15 2156 8     Pain Loc --      Pain Edu? --      Excl. in GC? --      Constitutional: Alert and oriented. Well appearing and in no acute distress. Eyes: Conjunctivae are  normal. PERRL. EOMI. Head: Atraumatic. Cardiovascular: Normal rate, regular rhythm. Normal S1 and S2.  Good peripheral circulation. Respiratory: Normal respiratory effort without tachypnea or retractions. Lungs CTAB. Good air entry to the bases with no decreased or absent breath sounds. Gastrointestinal: Bowel sounds 4 quadrants. Soft to palpation. Patient is tender to palpation in the suprapubic region. No other tenderness to palpation. No guarding or rigidity. No palpable masses. No distention. No CVA tenderness. Musculoskeletal: Full range of motion to all extremities. No gross deformities appreciated. Neurologic:  Normal speech and language. No gross focal neurologic deficits are appreciated.  Skin:  Skin is warm, dry and intact. No rash noted. Psychiatric: Mood and affect are normal. Speech and behavior are normal. Patient exhibits appropriate insight and judgement.   ____________________________________________   LABS (all labs ordered are listed, but only abnormal results are displayed)  Labs Reviewed  URINALYSIS COMPLETEWITH MICROSCOPIC ([facility] ONLY) - Abnormal; Notable for the following:    Color, Urine ORANGE (*)    APPearance HAZY (*)    Glucose, UA   (*)    Value: TEST NOT REPORTED DUE TO COLOR INTERFERENCE OF URINE PIGMENT   Bilirubin Urine   (*)    Value: TEST NOT REPORTED DUE TO COLOR INTERFERENCE OF URINE PIGMENT   Ketones, ur   (*)    Value: TEST NOT REPORTED DUE TO COLOR INTERFERENCE OF URINE PIGMENT   Hgb urine dipstick   (*)    Value: TEST NOT REPORTED DUE TO COLOR INTERFERENCE OF URINE PIGMENT   Protein, ur   (*)    Value: TEST NOT REPORTED DUE TO COLOR INTERFERENCE OF URINE PIGMENT   Nitrite   (*)    Value: TEST NOT REPORTED DUE TO COLOR INTERFERENCE OF URINE PIGMENT   Leukocytes, UA   (*)    Value: TEST NOT REPORTED DUE TO COLOR INTERFERENCE OF URINE PIGMENT   Bacteria, UA MANY (*)    Squamous Epithelial / LPF 6-30 (*)    All other components within normal  limits   ____________________________________________  EKG   ____________________________________________  RADIOLOGY   No results found.  ____________________________________________    PROCEDURES  Procedure(s) performed:       Medications  ciprofloxacin (CIPRO) tablet 500 mg (not administered)     ____________________________________________   INITIAL IMPRESSION / ASSESSMENT AND  PLAN / ED COURSE  Pertinent labs & imaging results that were available during my care of the patient were reviewed by me and considered in my medical decision making (see chart for details).  Patient's diagnosis is consistent with UTI. Patient used Azo prior to arrival and urinalysis is inconclusive due to this. As such, patient will be treated based off the symptomology. Patient has used Cipro in the past and request same antibiotic.Marland Kitchen Patient will be discharged home with prescriptions for Cipro. Patient may take Tylenol, Motrin, Azo at home for symptom relief.. Patient is to follow up with primary care provider as needed or otherwise directed. Patient is given ED precautions to return to the ED for any worsening or new symptoms.     ____________________________________________  FINAL CLINICAL IMPRESSION(S) / ED DIAGNOSES  Final diagnoses:  UTI (lower urinary tract infection)      NEW MEDICATIONS STARTED DURING THIS VISIT:  New Prescriptions   CIPROFLOXACIN (CIPRO) 500 MG TABLET    Take 1 tablet (500 mg total) by mouth 2 (two) times daily.        This chart was dictated using voice recognition software/Dragon. Despite best efforts to proofread, errors can occur which can change the meaning. Any change was purely unintentional.    Racheal Patches, PA-C 07/09/15 2326  Minna Antis, MD 07/09/15 2328

## 2015-07-27 ENCOUNTER — Encounter: Payer: Self-pay | Admitting: Emergency Medicine

## 2015-07-27 ENCOUNTER — Emergency Department
Admission: EM | Admit: 2015-07-27 | Discharge: 2015-07-27 | Disposition: A | Discharge status: Home / Self Care | Payer: BLUE CROSS/BLUE SHIELD | Attending: Emergency Medicine | Admitting: Emergency Medicine

## 2015-07-27 DIAGNOSIS — N3000 Acute cystitis without hematuria: Secondary | ICD-10-CM | POA: Insufficient documentation

## 2015-07-27 DIAGNOSIS — Z7951 Long term (current) use of inhaled steroids: Secondary | ICD-10-CM | POA: Insufficient documentation

## 2015-07-27 DIAGNOSIS — F17210 Nicotine dependence, cigarettes, uncomplicated: Secondary | ICD-10-CM | POA: Diagnosis not present

## 2015-07-27 DIAGNOSIS — F329 Major depressive disorder, single episode, unspecified: Secondary | ICD-10-CM | POA: Diagnosis not present

## 2015-07-27 DIAGNOSIS — N390 Urinary tract infection, site not specified: Secondary | ICD-10-CM | POA: Diagnosis present

## 2015-07-27 LAB — URINALYSIS COMPLETE WITH MICROSCOPIC (ARMC ONLY)
Bilirubin Urine: NEGATIVE
Glucose, UA: NEGATIVE mg/dL
KETONES UR: NEGATIVE mg/dL
Nitrite: NEGATIVE
PH: 5 (ref 5.0–8.0)
Protein, ur: 100 mg/dL — AB
Specific Gravity, Urine: 1.021 (ref 1.005–1.030)

## 2015-07-27 LAB — POCT PREGNANCY, URINE: Preg Test, Ur: NEGATIVE

## 2015-07-27 MED ORDER — NITROFURANTOIN MONOHYD MACRO 100 MG PO CAPS
100.0000 mg | ORAL_CAPSULE | Freq: Once | ORAL | Status: AC
  Administered 2015-07-27: 100 mg via ORAL
  Filled 2015-07-27: qty 1

## 2015-07-27 MED ORDER — NITROFURANTOIN MONOHYD MACRO 100 MG PO CAPS: 100.0000 mg | ORAL_CAPSULE | Freq: Two times a day (BID) | ORAL | Status: AC

## 2015-07-27 NOTE — ED Provider Notes (Signed)
[facility] Emergency Department Provider Note  ____________________________________________  Time seen: Approximately 10:14 PM  I have reviewed the triage vital signs and the nursing notes.   HISTORY  Chief Complaint Urinary Tract Infection    HPI Debra Wang is a 40 y.o. female who presents to the emergency department for evaluation of dysuria. She states that for the second month in a row, she has developed a UTI after starting her menstrual cycle. She states she took her antibiotics as prescribed and felt better until 2 days ago. She states she "feels like peeing needles."  Past Medical History  Diagnosis Date  . Anxiety   . Depression   . Hemorrhoid   . Anxiety   . Depression   . UTI (lower urinary tract infection)   . UTI (lower urinary tract infection)     There are no active problems to display for this patient.   Past Surgical History  Procedure Laterality Date  . Tubal ligation  2009  . Tubal ligation      Current Outpatient Rx  Name  Route  Sig  Dispense  Refill  . amoxicillin-clavulanate (AUGMENTIN) 875-125 MG tablet   Oral   Take 1 tablet by mouth 2 (two) times daily.   14 tablet   0   . cephALEXin (KEFLEX) 500 MG capsule   Oral   Take 1 capsule (500 mg total) by mouth 4 (four) times daily.   40 capsule   0   . cetirizine (ZYRTEC) 10 MG tablet   Oral   Take 1 tablet (10 mg total) by mouth daily.   30 tablet   0   . ciprofloxacin (CIPRO) 500 MG tablet   Oral   Take 1 tablet (500 mg total) by mouth 2 (two) times daily.   10 tablet   0   . citalopram (CELEXA) 40 MG tablet   Oral   Take 40 mg by mouth daily.         . clonazePAM (KLONOPIN) 0.5 MG tablet   Oral   Take 0.5 mg by mouth at bedtime.         . fluticasone (FLONASE) 50 MCG/ACT nasal spray   Each Nare   Place 1 spray into both nostrils 2 (two) times daily.   16 g   0   . nitrofurantoin, macrocrystal-monohydrate, (MACROBID) 100 MG capsule    Oral   Take 1 capsule (100 mg total) by mouth 2 (two) times daily.   14 capsule   0   . phenazopyridine (PYRIDIUM) 200 MG tablet   Oral   Take 1 tablet (200 mg total) by mouth 3 (three) times daily as needed for pain.   6 tablet   0   . traMADol (ULTRAM) 50 MG tablet   Oral   Take 1 tablet (50 mg total) by mouth every 6 (six) hours as needed for moderate pain.   12 tablet   0   . traZODone (DESYREL) 50 MG tablet   Oral   Take 50 mg by mouth at bedtime. prn           Allergies Benadryl and Sulfa antibiotics  History reviewed. No pertinent family history.  Social History Social History  Substance Use Topics  . Smoking status: Current Every Day Smoker -- 0.50 packs/day    Types: Cigarettes  . Smokeless tobacco: Never Used  . Alcohol Use: No    Review of Systems Constitutional: Negative for fever. Respiratory: Negative for shortness of breath  or cough. Gastrointestinal: Negative for abdominal pain; negative for nausea , negative for vomiting. Genitourinary: Positive for dysuria , negative for vaginal discharge. Musculoskeletal: Positive for back pain. Skin: Negative. ____________________________________________   PHYSICAL EXAM:  VITAL SIGNS: ED Triage Vitals  Enc Vitals Group     BP 07/27/15 2027 135/99 mmHg     Pulse Rate 07/27/15 2027 93     Resp 07/27/15 2027 18     Temp 07/27/15 2027 97.8 F (36.6 C)     Temp Source 07/27/15 2027 Oral     SpO2 07/27/15 2027 98 %     Weight 07/27/15 2027 150 lb (68.04 kg)     Height 07/27/15 2027 5\' 5"  (1.651 m)     Head Cir --      Peak Flow --      Pain Score 07/27/15 2033 0     Pain Loc --      Pain Edu? --      Excl. in GC? --     Constitutional: Alert and oriented. Well appearing and in no acute distress. Eyes: Conjunctivae are normal. PERRL. EOMI. Head: Atraumatic. Nose: No congestion/rhinnorhea. Mouth/Throat: Mucous membranes are moist. Respiratory: Normal respiratory effort.  No  retractions. Gastrointestinal: Abdomen soft without rebound tenderness or guarding. Genitourinary: Pelvic exam: deferred. Musculoskeletal: No extremity tenderness nor edema.  Neurologic:  Normal speech and language. No gross focal neurologic deficits are appreciated. Speech is normal. No gait instability. Skin:  Skin is warm, dry and intact. No rash noted. Psychiatric: Mood and affect are normal. Speech and behavior are normal.  ____________________________________________   LABS (all labs ordered are listed, but only abnormal results are displayed)  Labs Reviewed  URINALYSIS COMPLETEWITH MICROSCOPIC ([facility] ONLY) - Abnormal; Notable for the following:    Color, Urine YELLOW (*)    APPearance CLOUDY (*)    Hgb urine dipstick 3+ (*)    Protein, ur 100 (*)    Leukocytes, UA 2+ (*)    Bacteria, UA RARE (*)    Squamous Epithelial / LPF 0-5 (*)    All other components within normal limits  POCT PREGNANCY, URINE   ____________________________________________  RADIOLOGY  Not indicated ____________________________________________   PROCEDURES  Procedure(s) performed: None  ____________________________________________   INITIAL IMPRESSION / ASSESSMENT AND PLAN / ED COURSE  Pertinent labs & imaging results that were available during my care of the patient were reviewed by me and considered in my medical decision making (see chart for details).  Patient will be given prescriptions for Macrobid today. She was advised to follow up with urology for symptoms that are not improving over week. She was advised to return to the ER for symptoms that change or worsen if unable to schedule an appointment. ____________________________________________   FINAL CLINICAL IMPRESSION(S) / ED DIAGNOSES  Final diagnoses:  Acute cystitis without hematuria    Note:  This document was prepared using Dragon voice recognition software and may include unintentional dictation errors.   Chinita PesterCari B  Shirl Ludington, FNP 07/27/15 2252  Arnaldo NatalPaul F Malinda, MD 07/27/15 [phone]

## 2015-07-27 NOTE — Discharge Instructions (Signed)

## 2015-07-27 NOTE — ED Notes (Signed)
Pt arrived to the ED for complaints of urinary problems. Pt reports having a history of frequent UTI's and she is experiencing the same symptoms, urinary frequency and some pain with urination. Pt is AOx4 in no apparent distress.

## 2015-08-02 ENCOUNTER — Ambulatory Visit (INDEPENDENT_AMBULATORY_CARE_PROVIDER_SITE_OTHER): Payer: BLUE CROSS/BLUE SHIELD | Admitting: Urology

## 2015-08-02 ENCOUNTER — Ambulatory Visit
Admission: RE | Admit: 2015-08-02 | Discharge: 2015-08-02 | Disposition: A | Discharge status: Home / Self Care | Payer: BLUE CROSS/BLUE SHIELD | Source: Ambulatory Visit | Attending: Urology | Admitting: Urology

## 2015-08-02 ENCOUNTER — Encounter: Payer: Self-pay | Admitting: Urology

## 2015-08-02 VITALS — BP 129/76 | HR 106 | Ht 64.0 in | Wt 175.9 lb

## 2015-08-02 DIAGNOSIS — N200 Calculus of kidney: Secondary | ICD-10-CM

## 2015-08-02 DIAGNOSIS — Z87440 Personal history of urinary (tract) infections: Secondary | ICD-10-CM | POA: Insufficient documentation

## 2015-08-02 LAB — URINALYSIS, COMPLETE
Bilirubin, UA: NEGATIVE
Glucose, UA: NEGATIVE
Ketones, UA: NEGATIVE
LEUKOCYTES UA: NEGATIVE
Nitrite, UA: NEGATIVE
PH UA: 5.5 (ref 5.0–7.5)
PROTEIN UA: NEGATIVE
RBC, UA: NEGATIVE
Specific Gravity, UA: 1.025 (ref 1.005–1.030)
Urobilinogen, Ur: 0.2 mg/dL (ref 0.2–1.0)

## 2015-08-02 LAB — BLADDER SCAN AMB NON-IMAGING: Scan Result: 0

## 2015-08-02 LAB — MICROSCOPIC EXAMINATION

## 2015-08-02 NOTE — Progress Notes (Signed)
08/02/2015 9:21 PM   Debra Wang [birth year] [id number]  Referring provider: Dalbert Garnet, MD [address] [place], [place] [address]  Chief Complaint  Patient presents with  . New Patient (Initial Visit)    Nephrolithiasis    HPI: 40 yo F with recurrent UTIs who presents for further evaluation.  She has been seen the the emergency room on numerous occasions for her urinary symptoms.  She has a PCP but often will get infections on nights and weekends which see feels need urgent attention.  Symptoms with infection include severe dysuria, lower abdominal pressure, and frequency.     Unfortunately, no UCx were ever sent from ED therefore no urine culture data for review.  She has recurrent infections ~20 years.  Over the past several years she had 1-2 year and over the past year she has had ~4 infections over the past year.    No known history of nephrolithiasis.   No associated fevers with infections.  Infections seem unrelated to sexually activity.     PVR today is minimal.     She s/p tubal.  She has a uterus with regular periods.  She is sexually active.  She does have pain with intercourse sporadically.  No POP symptoms.    Primary care is Dr. Christin Fudge.    She is currently on Macrobid x one week which was prescribed in the ED last week.  Currently asymptomatic.     PMH: Past Medical History  Diagnosis Date  . Anxiety   . Depression   . Hemorrhoid   . Anxiety   . Depression   . UTI (lower urinary tract infection)   . UTI (lower urinary tract infection)     Surgical History: Past Surgical History  Procedure Laterality Date  . Tubal ligation  2009  . Tubal ligation      Home Medications:    Medication List       This list is accurate as of: 08/02/15 11:59 PM.  Always use your most recent med list.               cetirizine 10 MG tablet  Commonly known as:  ZYRTEC  Take 1 tablet (10 mg total) by mouth daily.     citalopram 40 MG tablet    Commonly known as:  CELEXA  Take 40 mg by mouth daily.     clonazePAM 0.5 MG tablet  Commonly known as:  KLONOPIN  Take 0.5 mg by mouth at bedtime.     fluconazole 150 MG tablet  Commonly known as:  DIFLUCAN  Reported on 08/02/2015     fluticasone 50 MCG/ACT nasal spray  Commonly known as:  FLONASE  Place 1 spray into both nostrils 2 (two) times daily.     nitrofurantoin (macrocrystal-monohydrate) 100 MG capsule  Commonly known as:  MACROBID  Take 1 capsule (100 mg total) by mouth 2 (two) times daily.     phenazopyridine 200 MG tablet  Commonly known as:  PYRIDIUM  Take 1 tablet (200 mg total) by mouth 3 (three) times daily as needed for pain.     traZODone 50 MG tablet  Commonly known as:  DESYREL  Take 50 mg by mouth at bedtime. prn        Allergies:  Allergies  Allergen Reactions  . Benadryl [Diphenhydramine Hcl (Sleep)]     Seizures   . Diphenhydramine Other (See Comments)    Sort of like a seizure, but not a seizure,  disoriented  . Sulfa Antibiotics Nausea And Vomiting  . Sulfur Itching    Family History: History reviewed. No pertinent family history.  Social History:  reports that she has been smoking Cigarettes.  She has been smoking about 0.50 packs per day. She has never used smokeless tobacco. She reports that she does not drink alcohol or use illicit drugs.  ROS: UROLOGY Frequent Urination?: Yes Hard to postpone urination?: Yes Burning/pain with urination?: No Get up at night to urinate?: No Leakage of urine?: Yes Urine stream starts and stops?: No Trouble starting stream?: No Do you have to strain to urinate?: No Blood in urine?: No Urinary tract infection?: No Sexually transmitted disease?: Yes Injury to kidneys or bladder?: No Painful intercourse?: No Weak stream?: No Currently pregnant?: No Vaginal bleeding?: No Last menstrual period?: No  Gastrointestinal Nausea?: No Vomiting?: No Indigestion/heartburn?: Yes Diarrhea?:  No Constipation?: No  Constitutional Fever: No Night sweats?: No Weight loss?: No Fatigue?: No  Skin Skin rash/lesions?: No Itching?: No  Eyes Blurred vision?: No Double vision?: No  Ears/Nose/Throat Sore throat?: No Sinus problems?: No  Hematologic/Lymphatic Swollen glands?: No Easy bruising?: No  Cardiovascular Leg swelling?: No Chest pain?: No  Respiratory Cough?: No Shortness of breath?: No  Endocrine Excessive thirst?: No  Musculoskeletal Back pain?: Yes Joint pain?: No  Neurological Headaches?: No Dizziness?: No  Psychologic Depression?: Yes Anxiety?: Yes  Physical Exam: BP 129/76 mmHg  Pulse 106  Ht 5\' 4"  (1.626 m)  Wt 175 lb 14.4 oz (79.788 kg)  BMI 30.18 kg/m2  LMP 05/30/2015 (Exact Date)  Constitutional:  Alert and oriented, No acute distress. HEENT: [place] AT, moist mucus membranes.  Trachea midline, no masses. Cardiovascular: No clubbing, cyanosis, or edema. Respiratory: Normal respiratory effort, no increased work of breathing. GI: Abdomen is soft, nontender, nondistended, no abdominal masses GU: No CVA tenderness.  Skin: No rashes, bruises or suspicious lesions. Lymph: No cervical adenopathy. Neurologic: Grossly intact, no focal deficits, moving all 4 extremities. Psychiatric: Normal mood and affect.  Laboratory Data: Lab Results  Component Value Date   WBC 8.0 06/10/2014   HGB 12.8 06/10/2014   HCT 37.7 06/10/2014   MCV 80.9 06/10/2014   PLT 186 06/10/2014    Lab Results  Component Value Date   CREATININE 0.63 06/10/2014    Urinalysis Component     Latest Ref Rng 07/17/2013 05/02/2014 12/24/2014  Color - urine      Yellow Yellow   Clarity - urine      Cloudy Hazy   Glucose,UR     0-75 mg/dL Negative Negative   Bilirubin,UR     NEGATIVE Negative Negative   Ketone     NEGATIVE Negative Negative   SPECIFIC GRAVITY     1.003-1.030 1.025 1.025   Blood     NEGATIVE 2+ Negative   pH     5.0 - 8.0 5.0 5.0 5.0   Protein     NEGATIVE mg/dL 30 mg/dL Negative NEGATIVE  Nitrite     NEGATIVE Negative Negative NEGATIVE  Leukocyte Esterase     NEGATIVE 3+ Trace   RBC,UR     0-5 /HPF 180 /HPF 0-5   WBC UR     0-5 /HPF 1037 /HPF 0-5   Bacteria     NONE SEEN NONE SEEN NONE SEEN   Squamous Epithelial      4 /HPF 0-5   Transitional Epi          Mucous      PRESENT PRESENT PRESENT  WBC Clump      PRESENT    Amorphous Crystal          Color, Urine     YELLOW   YELLOW (A)  Appearance     CLEAR   CLOUDY (A)  Glucose     NEGATIVE mg/dL   NEGATIVE  Bilirubin Urine     NEGATIVE   NEGATIVE  Ketones, ur     NEGATIVE mg/dL   NEGATIVE  Specific Gravity, Urine     1.005 - 1.030   1.016  Hgb urine dipstick     NEGATIVE   1+ (A)  Leukocytes, UA     NEGATIVE   3+ (A)  RBC / HPF     0 - 5 RBC/hpf   0-5  WBC, UA     0 - 5 WBC/hpf   6-30  Bacteria, UA     NONE SEEN   RARE (A)  Squamous Epithelial / LPF     NONE SEEN   6-30 (A)   Component     Latest Ref Rng 07/09/2015  Color - urine        Clarity - urine        Glucose,UR     0-75 mg/dL   Bilirubin,UR     NEGATIVE   Ketone     NEGATIVE   SPECIFIC GRAVITY     1.003-1.030   Blood     NEGATIVE   pH     5.0 - 8.0 TEST NOT REPORTED DUE TO COLOR INTERFERENCE OF URINE PIGMENT  Protein     NEGATIVE mg/dL TEST NOT REPORTED DUE TO COLOR INTERFERENCE OF URINE PIGMENT (A)  Nitrite     NEGATIVE TEST NOT REPORTED DUE TO COLOR INTERFERENCE OF URINE PIGMENT (A)  Leukocyte Esterase     NEGATIVE   RBC,UR     0-5 /HPF   WBC UR     0-5 /HPF   Bacteria     NONE SEEN   Squamous Epithelial        Transitional Epi        Mucous      PRESENT  WBC Clump        Amorphous Crystal        Color, Urine     YELLOW ORANGE (A)  Appearance     CLEAR HAZY (A)  Glucose     NEGATIVE mg/dL TEST NOT REPORTED DUE TO COLOR INTERFERENCE OF URINE PIGMENT (A)  Bilirubin Urine     NEGATIVE TEST NOT REPORTED DUE TO COLOR INTERFERENCE OF URINE PIGMENT (A)   Ketones, ur     NEGATIVE mg/dL TEST NOT REPORTED DUE TO COLOR INTERFERENCE OF URINE PIGMENT (A)  Specific Gravity, Urine     1.005 - 1.030 1.015  Hgb urine dipstick     NEGATIVE TEST NOT REPORTED DUE TO COLOR INTERFERENCE OF URINE PIGMENT (A)  Leukocytes, UA     NEGATIVE TEST NOT REPORTED DUE TO COLOR INTERFERENCE OF URINE PIGMENT (A)  RBC / HPF     0 - 5 RBC/hpf 6-30  WBC, UA     0 - 5 WBC/hpf TOO NUMEROUS TO COUNT  Bacteria, UA     NONE SEEN MANY (A)  Squamous Epithelial / LPF     NONE SEEN 6-30 (A)   Component     Latest Ref Rng 07/27/2015  Color - urine        Clarity - urine        Glucose,UR  0-75 mg/dL   Bilirubin,UR     NEGATIVE   Ketone     NEGATIVE   SPECIFIC GRAVITY     1.003-1.030   Blood     NEGATIVE   pH     5.0 - 8.0 5.0  Protein     NEGATIVE mg/dL 161 (A)  Nitrite     NEGATIVE NEGATIVE  Leukocyte Esterase     NEGATIVE   RBC,UR     0-5 /HPF   WBC UR     0-5 /HPF   Bacteria     NONE SEEN   Squamous Epithelial        Transitional Epi        Mucous      PRESENT  WBC Clump        Amorphous Crystal        Color, Urine     YELLOW YELLOW (A)  Appearance     CLEAR CLOUDY (A)  Glucose     NEGATIVE mg/dL NEGATIVE  Bilirubin Urine     NEGATIVE NEGATIVE  Ketones, ur     NEGATIVE mg/dL NEGATIVE  Specific Gravity, Urine     1.005 - 1.030 1.021  Hgb urine dipstick     NEGATIVE 3+ (A)  Leukocytes, UA     NEGATIVE 2+ (A)  RBC / HPF     0 - 5 RBC/hpf TOO NUMEROUS TO COUNT  WBC, UA     0 - 5 WBC/hpf TOO NUMEROUS TO COUNT  Bacteria, UA     NONE SEEN RARE (A)  Squamous Epithelial / LPF     NONE SEEN 0-5 (A)    Pertinent Imaging: n/a   Assessment & Plan:   1. frequent UTI KUB ordered today to r/o stones as nidus of infections.  We reviewed basic UTI behavioral prevention techniques, adequate hydration, addition of probiotic, cranberry tabs, etc.    She was advised to f/u with any urinary symptoms (call for same day lab visit) in  order to collect urine culture and sensitivity data.  She will return if UTIs persist at this frequency.    We discussed abx stewardship today in detail.  Prefer to defer abx ppx unless frequency of UTIs persistent and have documented culture and sensitivity data.  Patient is aggreable with this plan.     - Urinalysis, Complete - CULTURE, URINE COMPREHENSIVE - Bladder Scan (Post Void Residual) in office   Return for prn (KUB today).  Vanna Scotland, MD  [facility] [address], [address] [place], [place] [address] [phone]

## 2015-08-03 ENCOUNTER — Telehealth: Payer: Self-pay | Admitting: *Deleted

## 2015-08-03 NOTE — Telephone Encounter (Signed)
-----   Message from Vanna ScotlandAshley Brandon, MD sent at 08/03/2015 12:02 PM EDT ----- Please let this patient know she has no kidney stones.    Vanna ScotlandAshley Brandon, MD

## 2015-08-03 NOTE — Telephone Encounter (Signed)
LMOM for patient to call office back. 

## 2015-08-04 NOTE — Telephone Encounter (Signed)
LMOM- per Dr. Apolinar JunesBrandon no kidney stones.

## 2015-08-05 ENCOUNTER — Encounter: Payer: Self-pay | Admitting: Urology

## 2015-08-05 LAB — CULTURE, URINE COMPREHENSIVE

## 2015-11-16 ENCOUNTER — Encounter: Payer: Self-pay | Admitting: Emergency Medicine

## 2015-11-16 ENCOUNTER — Emergency Department
Admission: EM | Admit: 2015-11-16 | Discharge: 2015-11-16 | Disposition: A | Discharge status: Home / Self Care | Payer: BLUE CROSS/BLUE SHIELD | Attending: Emergency Medicine | Admitting: Emergency Medicine

## 2015-11-16 DIAGNOSIS — N898 Other specified noninflammatory disorders of vagina: Secondary | ICD-10-CM

## 2015-11-16 DIAGNOSIS — J069 Acute upper respiratory infection, unspecified: Secondary | ICD-10-CM | POA: Insufficient documentation

## 2015-11-16 DIAGNOSIS — L292 Pruritus vulvae: Secondary | ICD-10-CM | POA: Insufficient documentation

## 2015-11-16 DIAGNOSIS — F17210 Nicotine dependence, cigarettes, uncomplicated: Secondary | ICD-10-CM | POA: Insufficient documentation

## 2015-11-16 LAB — WET PREP, GENITAL
Clue Cells Wet Prep HPF POC: NONE SEEN
SPERM: NONE SEEN
Trich, Wet Prep: NONE SEEN
YEAST WET PREP: NONE SEEN

## 2015-11-16 LAB — CHLAMYDIA/NGC RT PCR (ARMC ONLY)
Chlamydia Tr: NOT DETECTED
N GONORRHOEAE: NOT DETECTED

## 2015-11-16 LAB — URINALYSIS COMPLETE WITH MICROSCOPIC (ARMC ONLY)
Bacteria, UA: NONE SEEN
Bilirubin Urine: NEGATIVE
GLUCOSE, UA: NEGATIVE mg/dL
HGB URINE DIPSTICK: NEGATIVE
Ketones, ur: NEGATIVE mg/dL
NITRITE: NEGATIVE
PROTEIN: NEGATIVE mg/dL
RBC / HPF: NONE SEEN RBC/hpf (ref 0–5)
SPECIFIC GRAVITY, URINE: 1.004 — AB (ref 1.005–1.030)
pH: 8 (ref 5.0–8.0)

## 2015-11-16 MED ORDER — FLUTICASONE PROPIONATE 50 MCG/ACT NA SUSP: 2.0000 | Freq: Every day | NASAL | 2 refills | Status: DC

## 2015-11-16 NOTE — ED Provider Notes (Signed)
[facility] Emergency Department Provider Note   ____________________________________________   First MD Initiated Contact with Patient 11/16/15 1113     (approximate)  I have reviewed the triage vital signs and the nursing notes.   HISTORY  Chief Complaint Cough and Sore Throat   HPI Debra Wang is a 40 y.o. female is here with complaint of sore throat and a nonproductive cough for 2 days. Patient states she's had some body aches but is unaware of any fever or chills. Patient is not taking any over-the-counter medication for her cough. Patienthas decreased her smoking secondary to her upper respiratory symptoms. Patient also complains of vaginal itching. She denies any vaginal discharge. She gives a history of heavy periods in which she is using a tampon and a pad. She has not seen her gynecologist for this, she has a history of the same and has been on birth control pills in the past. Currently she rates her discomfort as an 8 out of 10.   Past Medical History:  Diagnosis Date  . Anxiety   . Anxiety   . Depression   . Depression   . Hemorrhoid   . UTI (lower urinary tract infection)   . UTI (lower urinary tract infection)     Patient Active Problem List   Diagnosis Date Noted  . Frequent UTI 07/22/2013  . Clinical depression 04/24/2010    Past Surgical History:  Procedure Laterality Date  . TUBAL LIGATION  2009  . TUBAL LIGATION      Prior to Admission medications   Medication Sig Start Date End Date Taking? Authorizing Provider  citalopram (CELEXA) 40 MG tablet Take 40 mg by mouth daily.    Historical Provider, MD  clonazePAM (KLONOPIN) 0.5 MG tablet Take 0.5 mg by mouth at bedtime.    Historical Provider, MD  fluticasone (FLONASE) 50 MCG/ACT nasal spray Place 2 sprays into both nostrils daily. 11/16/15 11/15/16  Tommi Rumps, PA-C  phenazopyridine (PYRIDIUM) 200 MG tablet Take 1 tablet (200 mg total) by mouth 3 (three) times  daily as needed for pain. 12/24/14   Joni Reining, PA-C  traZODone (DESYREL) 50 MG tablet Take 50 mg by mouth at bedtime. prn    Historical Provider, MD    Allergies Benadryl [diphenhydramine hcl (sleep)]; Diphenhydramine; Sulfa antibiotics; and Sulfur  History reviewed. No pertinent family history.  Social History Social History  Substance Use Topics  . Smoking status: Current Every Day Smoker    Packs/day: 0.50    Types: Cigarettes  . Smokeless tobacco: Never Used  . Alcohol use No    Review of Systems Constitutional: No fever/chills Eyes: No visual changes. ENT: Positive sore throat. Cardiovascular: Denies chest pain. Respiratory: Denies shortness of breath. Positive nonproductive cough. Gastrointestinal: No abdominal pain.  No nausea, no vomiting.  No diarrhea.  Genitourinary: Negative for dysuria. Positive vaginal itching. Musculoskeletal: Negative for back pain. Skin: Negative for rash. Neurological: Negative for headaches, focal weakness or numbness.  10-point ROS otherwise negative.  ____________________________________________   PHYSICAL EXAM:  VITAL SIGNS: ED Triage Vitals  Enc Vitals Group     BP 11/16/15 1017 133/86     Pulse Rate 11/16/15 1017 93     Resp 11/16/15 1017 18     Temp 11/16/15 1017 98.1 F (36.7 C)     Temp Source 11/16/15 1017 Oral     SpO2 11/16/15 1017 100 %     Weight 11/16/15 1018 170 lb (77.1 kg)  Height 11/16/15 1018 5\' 5"  (1.651 m)     Head Circumference --      Peak Flow --      Pain Score 11/16/15 1018 8     Pain Loc --      Pain Edu? --      Excl. in GC? --     Constitutional: Alert and oriented. Well appearing and in no acute distress. Eyes: Conjunctivae are normal. PERRL. EOMI. Head: Atraumatic. Nose: Mild congestion/rhinnorhea.  EACs and TMs are clear bilaterally. Mouth/Throat: Mucous membranes are moist.  Oropharynx non-erythematous. Moderate posterior drainage. Neck: No stridor.     Hematological/Lymphatic/Immunilogical: No cervical lymphadenopathy. Cardiovascular: Normal rate, regular rhythm. Grossly normal heart sounds.  Good peripheral circulation. Respiratory: Normal respiratory effort.  No retractions. Lungs CTAB. Genitourinary: External genitalia without abnormality. There is some irritation along the vaginal walls but no exudate was seen. There is no adnexal masses or tenderness present. There is no chandelier sign present. There is some mucus present in the vaginal vault. Gastrointestinal: Soft and nontender. No distention.  Musculoskeletal: No lower extremity tenderness nor edema.  No joint effusions. Neurologic:  Normal speech and language. No gross focal neurologic deficits are appreciated. No gait instability. Skin:  Skin is warm, dry and intact. No rash noted. Psychiatric: Mood and affect are normal. Speech and behavior are normal.  ____________________________________________   LABS (all labs ordered are listed, but only abnormal results are displayed)  Labs Reviewed  WET PREP, GENITAL - Abnormal; Notable for the following:       Result Value   WBC, Wet Prep HPF POC MANY (*)    All other components within normal limits  URINALYSIS COMPLETEWITH MICROSCOPIC ([facility] ONLY) - Abnormal; Notable for the following:    Color, Urine STRAW (*)    APPearance CLEAR (*)    Specific Gravity, Urine 1.004 (*)    Leukocytes, UA TRACE (*)    Squamous Epithelial / LPF 0-5 (*)    All other components within normal limits  CHLAMYDIA/NGC RT PCR ([facility] ONLY)    PROCEDURES  Procedure(s) performed: None  Procedures  Critical Care performed: No  ____________________________________________   INITIAL IMPRESSION / ASSESSMENT AND PLAN / ED COURSE  Pertinent labs & imaging results that were available during my care of the patient were reviewed by me and considered in my medical decision making (see chart for details).    Clinical Course   She was given  a prescription for Flonase nasal spray and she is to obtain over-the-counter Zyrtec or Claritin which ever is cheaper. We discussed the findings of her wet prep and she is aware that should her gonorrhea and chlamydia tests come back positive she will receive a phone call. Patient was also encouraged to follow-up with her OB/GYN since she is having heavy menstrual periods. There was no yeast on her wet prep to explain her symptoms.  ____________________________________________   FINAL CLINICAL IMPRESSION(S) / ED DIAGNOSES  Final diagnoses:  Acute upper respiratory infection  Vaginal itching      NEW MEDICATIONS STARTED DURING THIS VISIT:  Discharge Medication List as of 11/16/2015  1:45 PM       Note:  This document was prepared using Dragon voice recognition software and may include unintentional dictation errors.    Tommi RumpsRhonda L Summers, PA-C 11/16/15 1804    Sharman CheekPhillip Stafford, MD 11/18/15 2031

## 2015-11-16 NOTE — ED Notes (Signed)
Patient tolerated pelvic exam well. 

## 2015-11-16 NOTE — Discharge Instructions (Signed)
Follow-up with your primary care doctor if any continued problems. Also make an appointment to talk to your gynecologist about your heavy periods. Flonase nasal spray as needed for nasal congestion. Begin taking either Claritin or Zyrtec which is over-the-counter.

## 2015-11-16 NOTE — ED Triage Notes (Signed)
Pt to ed with c/o sore throat, dry cough,. Body aches x 2 days.

## 2015-12-15 ENCOUNTER — Emergency Department: Payer: Self-pay

## 2015-12-15 ENCOUNTER — Emergency Department
Admission: EM | Admit: 2015-12-15 | Discharge: 2015-12-15 | Disposition: A | Discharge status: Home / Self Care | Payer: Self-pay | Attending: Emergency Medicine | Admitting: Emergency Medicine

## 2015-12-15 DIAGNOSIS — F17210 Nicotine dependence, cigarettes, uncomplicated: Secondary | ICD-10-CM | POA: Insufficient documentation

## 2015-12-15 DIAGNOSIS — R3915 Urgency of urination: Secondary | ICD-10-CM | POA: Insufficient documentation

## 2015-12-15 DIAGNOSIS — R350 Frequency of micturition: Secondary | ICD-10-CM | POA: Insufficient documentation

## 2015-12-15 DIAGNOSIS — R109 Unspecified abdominal pain: Secondary | ICD-10-CM | POA: Insufficient documentation

## 2015-12-15 LAB — COMPREHENSIVE METABOLIC PANEL
ALBUMIN: 3.6 g/dL (ref 3.5–5.0)
ALK PHOS: 52 U/L (ref 38–126)
ALT: 16 U/L (ref 14–54)
ANION GAP: 5 (ref 5–15)
AST: 18 U/L (ref 15–41)
BUN: 11 mg/dL (ref 6–20)
CHLORIDE: 106 mmol/L (ref 101–111)
CO2: 26 mmol/L (ref 22–32)
Calcium: 8.8 mg/dL — ABNORMAL LOW (ref 8.9–10.3)
Creatinine, Ser: 0.63 mg/dL (ref 0.44–1.00)
GFR calc non Af Amer: >60 mL/min (ref >60)
GLUCOSE: 85 mg/dL (ref 65–99)
POTASSIUM: 4.1 mmol/L (ref 3.5–5.1)
SODIUM: 137 mmol/L (ref 135–145)
Total Bilirubin: 0.4 mg/dL (ref 0.3–1.2)
Total Protein: 6.8 g/dL (ref 6.5–8.1)

## 2015-12-15 LAB — URINALYSIS COMPLETE WITH MICROSCOPIC (ARMC ONLY)
BILIRUBIN URINE: NEGATIVE
Glucose, UA: NEGATIVE mg/dL
HGB URINE DIPSTICK: NEGATIVE
Ketones, ur: NEGATIVE mg/dL
NITRITE: NEGATIVE
PH: 6 (ref 5.0–8.0)
Protein, ur: NEGATIVE mg/dL
SPECIFIC GRAVITY, URINE: 1.01 (ref 1.005–1.030)

## 2015-12-15 LAB — CBC WITH DIFFERENTIAL/PLATELET
BASOS PCT: 1 %
Basophils Absolute: 0.1 10*3/uL (ref 0–0.1)
EOS ABS: 0.3 10*3/uL (ref 0–0.7)
EOS PCT: 5 %
HCT: 37.7 % (ref 35.0–47.0)
HEMOGLOBIN: 12.9 g/dL (ref 12.0–16.0)
Lymphocytes Relative: 22 %
Lymphs Abs: 1.3 10*3/uL (ref 1.0–3.6)
MCH: 27.5 pg (ref 26.0–34.0)
MCHC: 34.3 g/dL (ref 32.0–36.0)
MCV: 80.2 fL (ref 80.0–100.0)
MONOS PCT: 11 %
Monocytes Absolute: 0.7 10*3/uL (ref 0.2–0.9)
NEUTROS PCT: 61 %
Neutro Abs: 3.6 10*3/uL (ref 1.4–6.5)
PLATELETS: 187 10*3/uL (ref 150–440)
RBC: 4.7 MIL/uL (ref 3.80–5.20)
RDW: 16.3 % — ABNORMAL HIGH (ref 11.5–14.5)
WBC: 5.9 10*3/uL (ref 3.6–11.0)

## 2015-12-15 LAB — LIPASE, BLOOD: Lipase: 30 U/L (ref 11–51)

## 2015-12-15 MED ORDER — KETOROLAC TROMETHAMINE 30 MG/ML IJ SOLN: 15.0000 mg | Freq: Once | INTRAMUSCULAR | Status: DC

## 2015-12-15 MED ORDER — IBUPROFEN 400 MG PO TABS: 400.0000 mg | ORAL_TABLET | Freq: Four times a day (QID) | ORAL | 0 refills | Status: AC | PRN

## 2015-12-15 MED ORDER — ONDANSETRON HCL 4 MG/2ML IJ SOLN
4.0000 mg | Freq: Once | INTRAMUSCULAR | Status: AC
  Administered 2015-12-15: 4 mg via INTRAVENOUS
  Filled 2015-12-15: qty 2

## 2015-12-15 NOTE — ED Triage Notes (Signed)
Pt reports yesterday started with right sided flank pain, woke her up from her sleep. Pt reports pain radiates around to the front. C/o urinary frequency and urgency. Denies dysuria, NV or fevers.

## 2015-12-15 NOTE — Discharge Instructions (Signed)
Please return immediately if condition worsens. Please contact her primary physician or the physician you were given for referral. If you have any specialist physicians involved in her treatment and plan please also contact them. Thank you for using [place] [facility] emergency [facility]. ° °

## 2015-12-15 NOTE — ED Provider Notes (Signed)
Time Seen: Approximately0731  I have reviewed the triage notes  Chief Complaint: Flank Pain   History of Present Illness: Debra Wang is a 40 y.o. female *who states she started having some right flank pain which started yesterday. She states it seemed to be rather persistent through the time yesterday and into this morning. She states the pain woke her up early this morning. Patient denies any fever and states she has had some urinary frequency and urgency. She denies any hematuria. She denies any obvious pleuritic or positional discomfort. She has a history of recurrent urinary tract infections.   Past Medical History:  Diagnosis Date  . Anxiety   . Anxiety   . Depression   . Depression   . Hemorrhoid   . UTI (lower urinary tract infection)   . UTI (lower urinary tract infection)     Patient Active Problem List   Diagnosis Date Noted  . Frequent UTI 07/22/2013  . Clinical depression 04/24/2010    Past Surgical History:  Procedure Laterality Date  . TUBAL LIGATION  2009  . TUBAL LIGATION      Past Surgical History:  Procedure Laterality Date  . TUBAL LIGATION  2009  . TUBAL LIGATION      Current Outpatient Rx  . Order #: [id number] Class: Historical Med  . Order #: [id number] Class: Historical Med  . Order #: [id number] Class: Print  . Order #: [id number] Class: Print  . Order #: [id number] Class: Historical Med    Allergies:  Benadryl [diphenhydramine hcl (sleep)]; Diphenhydramine; Sulfa antibiotics; and Sulfur  Family History: No family history on file.  Social History: Social History  Substance Use Topics  . Smoking status: Current Every Day Smoker    Packs/day: 0.50    Types: Cigarettes  . Smokeless tobacco: Never Used  . Alcohol use No     Review of Systems:   10 point review of systems was performed and was otherwise negative:  Constitutional: No fever Eyes: No visual disturbances ENT: No sore throat, ear pain Cardiac: No chest  pain Respiratory: No shortness of breath, wheezing, or stridor Abdomen: NoAnterior abdominal pain, no vomiting, No diarrhea Endocrine: No weight loss, No night sweats Extremities: No peripheral edema, cyanosis Skin: No rashes, easy bruising Neurologic: No focal weakness, trouble with speech or swollowing Urologic: No hematuria She states that she is no wrist that she is pregnant and has had a tubal ligation. No current vaginal bleeding  Physical Exam:  ED Triage Vitals  Enc Vitals Group     BP 12/15/15 0725 129/86     Pulse Rate 12/15/15 0725 92     Resp 12/15/15 0725 20     Temp 12/15/15 0725 97.6 F (36.4 C)     Temp Source 12/15/15 0725 Oral     SpO2 12/15/15 0725 99 %     Weight 12/15/15 0726 165 lb (74.8 kg)     Height 12/15/15 0726 5\' 5"  (1.651 m)     Head Circumference --      Peak Flow --      Pain Score 12/15/15 0726 8     Pain Loc --      Pain Edu? --      Excl. in GC? --     General: Awake , Alert , and Oriented times 3; GCS 15 Head: Normal cephalic , atraumatic Eyes: Pupils equal , round, reactive to light Nose/Throat: No nasal drainage, patent upper airway without erythema or exudate.  Neck: Supple, Full range of motion, No  anterior adenopathy or palpable thyroid masses Lungs: Clear to ascultation without wheezes , rhonchi, or rales Heart: Regular rate, regular rhythm without murmurs , gallops , or rubs Abdomen: Soft, non tender without rebound, guarding , or rigidity; bowel sounds positive and symmetric in all 4 quadrants. No organomegaly .        Extremities: 2 plus symmetric pulses. No edema, clubbing or cyanosis Neurologic: normal ambulation, Motor symmetric without deficits, sensory intact Skin: warm, dry, no rashes   Labs:   All laboratory work was reviewed including any pertinent negatives or positives listed below:  Labs Reviewed  URINALYSIS COMPLETEWITH MICROSCOPIC ([facility] ONLY) - Abnormal; Notable for the following:       Result Value   Color,  Urine STRAW (*)    APPearance CLEAR (*)    Leukocytes, UA TRACE (*)    Bacteria, UA RARE (*)    Squamous Epithelial / LPF 0-5 (*)    All other components within normal limits  CBC WITH DIFFERENTIAL/PLATELET  COMPREHENSIVE METABOLIC PANEL  LIPASE, BLOOD  Normal urine Laboratory work was reviewed and showed no clinically significant abnormalities.   Radiology: *  "Ct Renal Stone Study  Result Date: 12/15/2015 CLINICAL DATA:  40 year old female with acute right-sided flank and abdominal pain with urinary frequency and urgency for 1 day. EXAM: CT ABDOMEN AND PELVIS WITHOUT CONTRAST TECHNIQUE: Multidetector CT imaging of the abdomen and pelvis was performed following the standard protocol without IV contrast. COMPARISON:  10/13/2012 CT FINDINGS: Please note that parenchymal abnormalities may be missed without intravenous contrast. Lower chest: No acute abnormality Hepatobiliary: The liver and gallbladder are unremarkable. There is no evidence of biliary dilatation. Pancreas: Unremarkable Spleen: Unremarkable Adrenals/Urinary Tract: The kidneys, adrenal glands and bladder are unremarkable. No urinary calculi are identified. Stomach/Bowel: Stomach is within normal limits. No evidence of bowel wall thickening, distention, or inflammatory changes. Vascular/Lymphatic: No significant vascular findings are present. No enlarged abdominal or pelvic lymph nodes. Reproductive: Uterus and bilateral adnexa are unremarkable. Other: No abdominal wall hernia or abnormality. No abdominopelvic ascites. Musculoskeletal: No acute or significant osseous findings. 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 IMPRESSION: No evidence of acute abnormality. 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 Electronically Signed   By: Harmon PierJeffrey  Hu M.D.   On: 12/15/2015 09:34  "    I personally reviewed the radiologic studies    ED Course: Patient's stay here was  uneventful and she had relief with Toradol IV. Patient's differential included renal colic, pyelonephritis, acute cholecystitis, urinary tract infection, ovarian cysts, etc. Given her presentation not sure the exact cause of her flank pain possibly musculoskeletal. I felt comfortable with discharge at this point. She has normal laboratory work in no acute findings per CAT scan evaluation. Clinical Course      Assessment: * Acute unspecified right-sided flank pain   Final Clinical Impression  Final diagnoses:  Right flank pain     Plan: * Outpatient " Discharge Medication List as of 12/15/2015  9:46 AM    START taking these medications   Details  ibuprofen (ADVIL,MOTRIN) 400 MG tablet Take 1 tablet (400 mg total) by mouth every 6 (six) hours as needed., Starting Fri 12/15/2015, Print      " Patient was advised to return immediately if condition worsens. Patient was advised to follow up with their primary care physician or other specialized physicians involved in their outpatient care. The patient and/or family member/power of attorney had laboratory results reviewed  at the bedside. All questions and concerns were addressed and appropriate discharge instructions were distributed by the nursing staff.             Jennye MoccasinBrian S Espn Zeman, MD 12/15/15 1004

## 2016-03-05 ENCOUNTER — Encounter: Payer: Self-pay | Admitting: Emergency Medicine

## 2016-03-05 ENCOUNTER — Emergency Department
Admission: EM | Admit: 2016-03-05 | Discharge: 2016-03-05 | Disposition: A | Discharge status: Home / Self Care | Payer: BLUE CROSS/BLUE SHIELD | Attending: Emergency Medicine | Admitting: Emergency Medicine

## 2016-03-05 DIAGNOSIS — Y999 Unspecified external cause status: Secondary | ICD-10-CM | POA: Insufficient documentation

## 2016-03-05 DIAGNOSIS — Y929 Unspecified place or not applicable: Secondary | ICD-10-CM | POA: Insufficient documentation

## 2016-03-05 DIAGNOSIS — F17210 Nicotine dependence, cigarettes, uncomplicated: Secondary | ICD-10-CM | POA: Insufficient documentation

## 2016-03-05 DIAGNOSIS — S0501XA Injury of conjunctiva and corneal abrasion without foreign body, right eye, initial encounter: Secondary | ICD-10-CM

## 2016-03-05 DIAGNOSIS — X58XXXA Exposure to other specified factors, initial encounter: Secondary | ICD-10-CM | POA: Insufficient documentation

## 2016-03-05 DIAGNOSIS — Y939 Activity, unspecified: Secondary | ICD-10-CM | POA: Insufficient documentation

## 2016-03-05 MED ORDER — TETRACAINE HCL 0.5 % OP SOLN: 2.0000 [drp] | Freq: Once | OPHTHALMIC | Status: DC

## 2016-03-05 MED ORDER — FLUORESCEIN SODIUM 1 MG OP STRP
ORAL_STRIP | OPHTHALMIC | Status: AC
  Filled 2016-03-05: qty 1

## 2016-03-05 MED ORDER — ERYTHROMYCIN 5 MG/GM OP OINT: 1.0000 "application " | TOPICAL_OINTMENT | Freq: Three times a day (TID) | OPHTHALMIC | 0 refills | Status: DC

## 2016-03-05 MED ORDER — TETRACAINE HCL 0.5 % OP SOLN
OPHTHALMIC | Status: AC
  Filled 2016-03-05: qty 2

## 2016-03-05 MED ORDER — FLUORESCEIN SODIUM 0.6 MG OP STRP: 1.0000 | ORAL_STRIP | Freq: Once | OPHTHALMIC | Status: DC

## 2016-03-05 NOTE — ED Notes (Signed)
See triage note   Right eye irritated with some pain  Positive itching

## 2016-03-05 NOTE — ED Triage Notes (Addendum)
Pt with right eye redness and swelling, itching since last Thursday. Pt works around a lot of dust. Denies any vision changes.

## 2016-03-05 NOTE — ED Provider Notes (Signed)
[facility] Emergency Department Provider Note  ____________________________________________  Time seen: Approximately 4:51 PM  I have reviewed the triage vital signs and the nursing notes.   HISTORY  Chief Complaint Eye Problem    HPI Debra Wang is a 41 y.o. female that presents to the emergency department with right eye itching for 4 days.Patient states that eye itches and feels like she has something in it. Patient has been using Visine to try to flush eye. Patient works around dust. Patient states that she has seasonal allergies and uses Allegra. Patient denies any recent illness. No trauma. Patient does not work contacts. Patient denies vision changes, photophobia, eye discharge, floaters, flashers, eye swelling, headache, congestion, cough, shortness of breath, chest pain, nausea, vomiting, abdominal pain, rash.   Past Medical History:  Diagnosis Date  . Anxiety   . Anxiety   . Depression   . Depression   . Hemorrhoid   . UTI (lower urinary tract infection)   . UTI (lower urinary tract infection)     Patient Active Problem List   Diagnosis Date Noted  . Frequent UTI 07/22/2013  . Clinical depression 04/24/2010    Past Surgical History:  Procedure Laterality Date  . TUBAL LIGATION  2009  . TUBAL LIGATION      Prior to Admission medications   Medication Sig Start Date End Date Taking? Authorizing Provider  citalopram (CELEXA) 40 MG tablet Take 40 mg by mouth daily.    Historical Provider, MD  clonazePAM (KLONOPIN) 0.5 MG tablet Take 0.5 mg by mouth at bedtime.    Historical Provider, MD  erythromycin [facility](ROMYCIN) ophthalmic ointment Place 1 application into the right eye 3 (three) times daily. 03/05/16   Enid DerryAshley Nikyah Lackman, PA-C  fluticasone (FLONASE) 50 MCG/ACT nasal spray Place 2 sprays into both nostrils daily. 11/16/15 11/15/16  Tommi Rumpshonda L Summers, PA-C  ibuprofen (ADVIL,MOTRIN) 400 MG tablet Take 1 tablet (400 mg total) by mouth every 6 (six)  hours as needed. 12/15/15   Jennye MoccasinBrian S Quigley, MD  phenazopyridine (PYRIDIUM) 200 MG tablet Take 1 tablet (200 mg total) by mouth 3 (three) times daily as needed for pain. 12/24/14   Joni Reiningonald K Smith, PA-C  traZODone (DESYREL) 50 MG tablet Take 50 mg by mouth at bedtime. prn    Historical Provider, MD    Allergies Benadryl [diphenhydramine hcl (sleep)]; Diphenhydramine; Sulfa antibiotics; and Sulfur  No family history on file.  Social History Social History  Substance Use Topics  . Smoking status: Current Every Day Smoker    Packs/day: 0.50    Types: Cigarettes  . Smokeless tobacco: Never Used  . Alcohol use No     Review of Systems  Constitutional: No fever/chills Eyes: No visual changes. No discharge. ENT: Negative for congestion and rhinorrhea. Cardiovascular: No chest pain. Respiratory: Negative for cough. No SOB. Gastrointestinal: No abdominal pain.  No nausea, no vomiting.  No diarrhea.  No constipation. Musculoskeletal: Negative for musculoskeletal pain. Skin: Negative for rash, abrasions, lacerations, ecchymosis. Neurological: Negative for headaches.   ____________________________________________   PHYSICAL EXAM:  VITAL SIGNS: ED Triage Vitals  Enc Vitals Group     BP 03/05/16 1416 117/79     Pulse Rate 03/05/16 1416 93     Resp 03/05/16 1416 18     Temp 03/05/16 1416 98.4 F (36.9 C)     Temp Source 03/05/16 1416 Oral     SpO2 03/05/16 1416 98 %     Weight 03/05/16 1416 170 lb (77.1 kg)  Height 03/05/16 1416 5' 5.5" (1.664 m)     Head Circumference --      Peak Flow --      Pain Score 03/05/16 1424 7     Pain Loc --      Pain Edu? --      Excl. in GC? --      Constitutional: Alert and oriented. Well appearing and in no acute distress. Eyes: Conjunctivae are normal. PERRL. EOMI. No discharge. 1 mm abrasion seen on fluorescein stain in lower right quadrant. No hyphema or hypopyon.  Head: Atraumatic. ENT: No frontal and maxillary sinus tenderness.       Ears: Tympanic membranes pearly gray with good landmarks. No discharge.      Nose: Mild congestion/rhinnorhea.      Mouth/Throat: Mucous membranes are moist. Oropharynx non-erythematous. Tonsils not enlarged. No exudates. Uvula midline. Neck: No stridor.   Hematological/Lymphatic/Immunilogical: No cervical lymphadenopathy. Cardiovascular: Normal rate, regular rhythm. Good peripheral circulation. Respiratory: Normal respiratory effort without tachypnea or retractions. Lungs CTAB. Good air entry to the bases with no decreased or absent breath sounds. Musculoskeletal: Full range of motion to all extremities. No gross deformities appreciated. Neurologic:  Normal speech and language. No gross focal neurologic deficits are appreciated.  Skin:  Skin is warm, dry and intact. No rash noted. Psychiatric: Mood and affect are normal. Speech and behavior are normal. Patient exhibits appropriate insight and judgement.   ____________________________________________   LABS (all labs ordered are listed, but only abnormal results are displayed)  Labs Reviewed - No data to display ____________________________________________  EKG   ____________________________________________  RADIOLOGY   No results found.  ____________________________________________    PROCEDURES  Procedure(s) performed:    Procedures    Medications  tetracaine (PONTOCAINE) 0.5 % ophthalmic solution 2 drop (not administered)  fluorescein ophthalmic strip 1 strip (not administered)  fluorescein 1 MG ophthalmic strip (not administered)  tetracaine (PONTOCAINE) 0.5 % ophthalmic solution (not administered)     ____________________________________________   INITIAL IMPRESSION / ASSESSMENT AND PLAN / ED COURSE  Pertinent labs & imaging results that were available during my care of the patient were reviewed by me and considered in my medical decision making (see chart for details).  Review of the [place] CSRS was  performed in accordance of the [facility] prior to dispensing any controlled drugs.  Patient's diagnosis is consistent with corneal abrasion. Vital signs and exam are reassuring. 1 mm abrasion seen on fluorescein stain. Patient will be discharged home with prescriptions for erythromycin ointment. Patient is to follow up with [facility] as needed or otherwise directed. Patient is given ED precautions to return to the ED for any worsening or new symptoms.     ____________________________________________  FINAL CLINICAL IMPRESSION(S) / ED DIAGNOSES  Final diagnoses:  Abrasion of right cornea, initial encounter      NEW MEDICATIONS STARTED DURING THIS VISIT:  New Prescriptions   ERYTHROMYCIN (ROMYCIN) OPHTHALMIC OINTMENT    Place 1 application into the right eye 3 (three) times daily.        This chart was dictated using voice recognition software/Dragon. Despite best efforts to proofread, errors can occur which can change the meaning. Any change was purely unintentional.    Enid Derry, PA-C 03/06/16 0036    Minna Antis, MD 03/11/16 2037

## 2016-08-27 ENCOUNTER — Emergency Department: Payer: Self-pay

## 2016-08-27 ENCOUNTER — Emergency Department
Admission: EM | Admit: 2016-08-27 | Discharge: 2016-08-27 | Disposition: A | Discharge status: Home / Self Care | Payer: Self-pay | Attending: Emergency Medicine | Admitting: Emergency Medicine

## 2016-08-27 ENCOUNTER — Encounter: Payer: Self-pay | Admitting: Emergency Medicine

## 2016-08-27 DIAGNOSIS — R1013 Epigastric pain: Secondary | ICD-10-CM | POA: Insufficient documentation

## 2016-08-27 DIAGNOSIS — Z79899 Other long term (current) drug therapy: Secondary | ICD-10-CM | POA: Insufficient documentation

## 2016-08-27 DIAGNOSIS — F17210 Nicotine dependence, cigarettes, uncomplicated: Secondary | ICD-10-CM | POA: Insufficient documentation

## 2016-08-27 LAB — CBC
HCT: 37 % (ref 35.0–47.0)
Hemoglobin: 12.4 g/dL (ref 12.0–16.0)
MCH: 26.9 pg (ref 26.0–34.0)
MCHC: 33.4 g/dL (ref 32.0–36.0)
MCV: 80.3 fL (ref 80.0–100.0)
Platelets: 184 10*3/uL (ref 150–440)
RBC: 4.61 MIL/uL (ref 3.80–5.20)
RDW: 17.4 % — AB (ref 11.5–14.5)
WBC: 6 10*3/uL (ref 3.6–11.0)

## 2016-08-27 LAB — COMPREHENSIVE METABOLIC PANEL
ALBUMIN: 3.7 g/dL (ref 3.5–5.0)
ALT: 9 U/L — ABNORMAL LOW (ref 14–54)
ANION GAP: 6 (ref 5–15)
AST: 16 U/L (ref 15–41)
Alkaline Phosphatase: 55 U/L (ref 38–126)
BILIRUBIN TOTAL: 0.4 mg/dL (ref 0.3–1.2)
BUN: 9 mg/dL (ref 6–20)
CO2: 21 mmol/L — AB (ref 22–32)
Calcium: 8.5 mg/dL — ABNORMAL LOW (ref 8.9–10.3)
Chloride: 110 mmol/L (ref 101–111)
Creatinine, Ser: 0.62 mg/dL (ref 0.44–1.00)
GFR calc non Af Amer: >60 mL/min (ref >60)
GLUCOSE: 100 mg/dL — AB (ref 65–99)
POTASSIUM: 3.8 mmol/L (ref 3.5–5.1)
SODIUM: 137 mmol/L (ref 135–145)
TOTAL PROTEIN: 6.6 g/dL (ref 6.5–8.1)

## 2016-08-27 LAB — POCT PREGNANCY, URINE: PREG TEST UR: NEGATIVE

## 2016-08-27 LAB — TROPONIN I

## 2016-08-27 LAB — LIPASE, BLOOD: Lipase: 33 U/L (ref 11–51)

## 2016-08-27 MED ORDER — ONDANSETRON HCL 4 MG/2ML IJ SOLN
4.0000 mg | Freq: Once | INTRAMUSCULAR | Status: AC
  Administered 2016-08-27: 4 mg via INTRAVENOUS
  Filled 2016-08-27: qty 2

## 2016-08-27 MED ORDER — GI COCKTAIL ~~LOC~~
30.0000 mL | Freq: Once | ORAL | Status: AC
  Administered 2016-08-27: 30 mL via ORAL
  Filled 2016-08-27: qty 30

## 2016-08-27 MED ORDER — PANTOPRAZOLE SODIUM 20 MG PO TBEC: 20.0000 mg | DELAYED_RELEASE_TABLET | Freq: Every day | ORAL | 1 refills | Status: DC

## 2016-08-27 NOTE — ED Notes (Signed)
Pt resting in bed at this time on phone. NAD noted. Pt denies any needs, states continued nausea at this time. Explained to patient waiting on results of US at this time. Pt states understanding at this time.

## 2016-08-27 NOTE — ED Triage Notes (Signed)
Nausea today , epigastric (rib/chest) pain x2 weeks

## 2016-08-27 NOTE — ED Provider Notes (Signed)
[facility] Emergency Department Provider Note   ____________________________________________    I have reviewed the triage vital signs and the nursing notes.   HISTORY  Chief Complaint Epigastric pain    HPI Debra Wang is a 41 y.o. female who presents with complaints of epigastric pain. Patient reports pain in her epigastrium which is mild to moderate for approximately 2 weeks, she reports she gets worse after eating. She also reports mild nausea no vomiting. Occasionally she has a burning discomfort in her chest. She denies fevers or chills. No cough. No history of the same. She does smoke cigarettes. She does not drink alcohol. No history of pancreatic issues or gastritis/PUD   Past Medical History:  Diagnosis Date  . Anxiety   . Anxiety   . Depression   . Depression   . Hemorrhoid   . UTI (lower urinary tract infection)   . UTI (lower urinary tract infection)     Patient Active Problem List   Diagnosis Date Noted  . Frequent UTI 07/22/2013  . Clinical depression 04/24/2010    Past Surgical History:  Procedure Laterality Date  . TUBAL LIGATION  2009  . TUBAL LIGATION      Prior to Admission medications   Medication Sig Start Date End Date Taking? Authorizing Provider  albuterol (PROVENTIL HFA;VENTOLIN HFA) 108 (90 Base) MCG/ACT inhaler Inhale into the lungs. 01/19/14  Yes [provider]  citalopram (CELEXA) 40 MG tablet Take 40 mg by mouth daily.   Yes [provider]  clonazePAM (KLONOPIN) 0.5 MG tablet Take 0.5 mg by mouth at bedtime.   Yes [provider]  ibuprofen (ADVIL,MOTRIN) 400 MG tablet Take 1 tablet (400 mg total) by mouth every 6 (six) hours as needed. 12/15/15  Yes Jennye MoccasinQuigley, Brian S, MD  traZODone (DESYREL) 50 MG tablet Take 50 mg by mouth at bedtime. prn   Yes [provider]  erythromycin [facility](ROMYCIN) ophthalmic ointment Place 1 application into the right eye 3 (three) times  daily. Patient not taking: Reported on 08/27/2016 03/05/16   Enid DerryWagner, Ashley, PA-C  fluticasone Scnetx(FLONASE) 50 MCG/ACT nasal spray Place 2 sprays into both nostrils daily. Patient not taking: Reported on 08/27/2016 11/16/15 11/15/16  Tommi RumpsSummers, Rhonda L, PA-C  pantoprazole (PROTONIX) 20 MG tablet Take 1 tablet (20 mg total) by mouth daily. 08/27/16 08/27/17  Jene EveryKinner, Rashidi Loh, MD  phenazopyridine (PYRIDIUM) 200 MG tablet Take 1 tablet (200 mg total) by mouth 3 (three) times daily as needed for pain. Patient not taking: Reported on 08/27/2016 12/24/14   Joni ReiningSmith, Ronald K, PA-C     Allergies Benadryl [diphenhydramine hcl (sleep)]; Diphenhydramine; Sulfa antibiotics; and Sulfur  No family history on file.  Social History Social History  Substance Use Topics  . Smoking status: Current Every Day Smoker    Packs/day: 0.50    Types: Cigarettes  . Smokeless tobacco: Never Used  . Alcohol use No    Review of Systems  Constitutional: No fever/chills Eyes: No visual changes.  ENT: No sore throat. Cardiovascular: As above Respiratory: Denies shortness of breath. Gastrointestinal: As above   Genitourinary: Negative for dysuria. Musculoskeletal: Negative for back pain. Skin: Negative for rash. Neurological: Negative for headaches    ____________________________________________   PHYSICAL EXAM:  VITAL SIGNS: ED Triage Vitals  Enc Vitals Group     BP 08/27/16 0822 (!) 118/103     Pulse Rate 08/27/16 0822 88     Resp 08/27/16 0822 18     Temp 08/27/16 11910822  97.9 F (36.6 C)     Temp Source 08/27/16 0822 Oral     SpO2 08/27/16 0822 99 %     Weight 08/27/16 0822 72.6 kg (160 lb)     Height 08/27/16 0822 1.664 m (5' 5.5")     Head Circumference --      Peak Flow --      Pain Score 08/27/16 0821 7     Pain Loc --      Pain Edu? --      Excl. in GC? --     Constitutional: Alert and oriented. No acute distress. Pleasant and interactive Eyes: Conjunctivae are normal.  Head: Atraumatic. Nose: No  congestion/rhinnorhea. Mouth/Throat: Mucous membranes are moist.    Cardiovascular: Normal rate, regular rhythm. Grossly normal heart sounds.  Good peripheral circulation. Respiratory: Normal respiratory effort.  No retractions. Lungs CTAB. Gastrointestinal: Mild discomfort in the epigastrium. No distention.  No CVA tenderness. Genitourinary: deferred Musculoskeletal: No lower extremity tenderness nor edema.  Warm and well perfused Neurologic:  Normal speech and language. No gross focal neurologic deficits are appreciated.  Skin:  Skin is warm, dry and intact. No rash noted. Psychiatric: Mood and affect are normal. Speech and behavior are normal.  ____________________________________________   LABS (all labs ordered are listed, but only abnormal results are displayed)  Labs Reviewed  CBC - Abnormal; Notable for the following:       Result Value   RDW 17.4 (*)    All other components within normal limits  COMPREHENSIVE METABOLIC PANEL - Abnormal; Notable for the following:    CO2 21 (*)    Glucose, Bld 100 (*)    Calcium 8.5 (*)    ALT 9 (*)    All other components within normal limits  TROPONIN I  LIPASE, BLOOD  POCT PREGNANCY, URINE   ____________________________________________  EKG  ED ECG REPORT I, Jene Every, the attending physician, personally viewed and interpreted this ECG.  Date: 08/27/2016  Rhythm: normal sinus rhythm QRS Axis: normal Intervals: normal ST/T Wave abnormalities: normal Narrative Interpretation: unremarkable  ____________________________________________  RADIOLOGY  Ultrasound unremarkable ____________________________________________   PROCEDURES  Procedure(s) performed: No    Critical Care performed:No ____________________________________________   INITIAL IMPRESSION / ASSESSMENT AND PLAN / ED COURSE  Pertinent labs & imaging results that were available during my care of the patient were reviewed by me and considered in my  medical decision making (see chart for details).  Patient well-appearing and in no acute distress. EKG is normal. Description is not consistent with ACS as her pain is primarily epigastric, suspect gastritis/PUD. Biliary colic is also a possibility as is pancreatitis. We will give GI cocktail check labs and reevaluate  Lab work is overall reassuring, patient had mild improvement with GI cocktail. Do suspect gastritis/PUD, we will start her on Protonix and have her follow-up with PCP for further evaluation.    ____________________________________________   FINAL CLINICAL IMPRESSION(S) / ED DIAGNOSES  Final diagnoses:  Epigastric pain      NEW MEDICATIONS STARTED DURING THIS VISIT:  Discharge Medication List as of 08/27/2016 10:59 AM    START taking these medications   Details  pantoprazole (PROTONIX) 20 MG tablet Take 1 tablet (20 mg total) by mouth daily., Starting Tue 08/27/2016, Until Wed 08/27/2017, Print         Note:  This document was prepared using Dragon voice recognition software and may include unintentional dictation errors.    Jene Every, MD 08/27/16 [phone]

## 2016-09-24 ENCOUNTER — Emergency Department
Admission: EM | Admit: 2016-09-24 | Discharge: 2016-09-24 | Disposition: A | Discharge status: Home / Self Care | Payer: BLUE CROSS/BLUE SHIELD | Attending: Emergency Medicine | Admitting: Emergency Medicine

## 2016-09-24 DIAGNOSIS — N3001 Acute cystitis with hematuria: Secondary | ICD-10-CM | POA: Insufficient documentation

## 2016-09-24 DIAGNOSIS — F17210 Nicotine dependence, cigarettes, uncomplicated: Secondary | ICD-10-CM | POA: Insufficient documentation

## 2016-09-24 DIAGNOSIS — Z79899 Other long term (current) drug therapy: Secondary | ICD-10-CM | POA: Insufficient documentation

## 2016-09-24 LAB — URINALYSIS, COMPLETE (UACMP) WITH MICROSCOPIC
BACTERIA UA: NONE SEEN
BILIRUBIN URINE: NEGATIVE
Glucose, UA: NEGATIVE mg/dL
KETONES UR: NEGATIVE mg/dL
NITRITE: NEGATIVE
PH: 5 (ref 5.0–8.0)
Protein, ur: 100 mg/dL — AB
SPECIFIC GRAVITY, URINE: 1.018 (ref 1.005–1.030)

## 2016-09-24 LAB — PREGNANCY, URINE: Preg Test, Ur: NEGATIVE

## 2016-09-24 MED ORDER — CEPHALEXIN 500 MG PO CAPS: 500.0000 mg | ORAL_CAPSULE | Freq: Three times a day (TID) | ORAL | 0 refills | Status: DC

## 2016-09-24 MED ORDER — ONDANSETRON 4 MG PO TBDP: 4.0000 mg | ORAL_TABLET | Freq: Three times a day (TID) | ORAL | 0 refills | Status: DC | PRN

## 2016-09-24 MED ORDER — ONDANSETRON 4 MG PO TBDP
4.0000 mg | ORAL_TABLET | Freq: Once | ORAL | Status: AC
  Administered 2016-09-24: 4 mg via ORAL
  Filled 2016-09-24: qty 1

## 2016-09-24 NOTE — ED Provider Notes (Signed)
[facility] Emergency Department Provider Note  ___________________________________________   First MD Initiated Contact with Patient 09/24/16 1023     (approximate)  I have reviewed the triage vital signs and the nursing notes.   HISTORY  Chief Complaint Urinary Frequency   HPI Debra Wang is a 41 y.o. female is here with complaint of dysuria for the last couple days which became much worse this morning. Patient states she does have a history of urinary tract infections. She states at present she has some nausea but no vomiting. She is unaware of any fever or chills. She has not had any UTI recently. She was her pain as an 8 out of 10.   Past Medical History:  Diagnosis Date  . Anxiety   . Anxiety   . Depression   . Depression   . Hemorrhoid   . UTI (lower urinary tract infection)   . UTI (lower urinary tract infection)     Patient Active Problem List   Diagnosis Date Noted  . Frequent UTI 07/22/2013  . Clinical depression 04/24/2010    Past Surgical History:  Procedure Laterality Date  . TUBAL LIGATION  2009  . TUBAL LIGATION      Prior to Admission medications   Medication Sig Start Date End Date Taking? Authorizing Provider  albuterol (PROVENTIL HFA;VENTOLIN HFA) 108 (90 Base) MCG/ACT inhaler Inhale into the lungs. 01/19/14   [provider]  cephALEXin (KEFLEX) 500 MG capsule Take 1 capsule (500 mg total) by mouth 3 (three) times daily. 09/24/16   Tommi Rumps, PA-C  citalopram (CELEXA) 40 MG tablet Take 40 mg by mouth daily.    [provider]  clonazePAM (KLONOPIN) 0.5 MG tablet Take 0.5 mg by mouth at bedtime.    [provider]  ibuprofen (ADVIL,MOTRIN) 400 MG tablet Take 1 tablet (400 mg total) by mouth every 6 (six) hours as needed. 12/15/15   Jennye Moccasin, MD  ondansetron (ZOFRAN ODT) 4 MG disintegrating tablet Take 1 tablet (4 mg total) by mouth every 8 (eight) hours as needed for nausea or  vomiting. 09/24/16   Tommi Rumps, PA-C  pantoprazole (PROTONIX) 20 MG tablet Take 1 tablet (20 mg total) by mouth daily. 08/27/16 08/27/17  Jene Every, MD  traZODone (DESYREL) 50 MG tablet Take 50 mg by mouth at bedtime. prn    [provider]    Allergies Benadryl [diphenhydramine hcl (sleep)]; Diphenhydramine; Sulfa antibiotics; and Sulfur  No family history on file.  Social History Social History  Substance Use Topics  . Smoking status: Current Every Day Smoker    Packs/day: 0.50    Types: Cigarettes  . Smokeless tobacco: Never Used  . Alcohol use No    Review of Systems Constitutional: No fever/chills Cardiovascular: Denies chest pain. Respiratory: Denies shortness of breath. Gastrointestinal:   No nausea, no vomiting.  Genitourinary: Positive for dysuria. Musculoskeletal: Negative for back pain. ____________________________________________   PHYSICAL EXAM:  VITAL SIGNS: ED Triage Vitals [09/24/16 0958]  Enc Vitals Group     BP (!) 132/104     Pulse Rate (!) 109     Resp 18     Temp 97.9 F (36.6 C)     Temp Source Oral     SpO2 100 %     Weight 160 lb (72.6 kg)     Height 5\' 5"  (1.651 m)     Head Circumference      Peak Flow  Pain Score 8     Pain Loc      Pain Edu?      Excl. in GC?    Constitutional: Alert and oriented. Well appearing and in no acute distress. Eyes: Conjunctivae are normal.  Head: Atraumatic. Neck: No stridor.   Cardiovascular: Normal rate, regular rhythm. Grossly normal heart sounds.  Good peripheral circulation. Respiratory: Normal respiratory effort.  No retractions. Lungs CTAB. Gastrointestinal: No CVA tenderness. Musculoskeletal:Moves upper and lower extremities without any difficulty. Normal gait was noted. Neurologic:  Normal speech and language. No gross focal neurologic deficits are appreciated.  Skin:  Skin is warm, dry and intact.  Psychiatric: Mood and affect are normal. Speech and behavior are  normal.  ____________________________________________   LABS (all labs ordered are listed, but only abnormal results are displayed)  Labs Reviewed  URINALYSIS, COMPLETE (UACMP) WITH MICROSCOPIC - Abnormal; Notable for the following:       Result Value   Color, Urine YELLOW (*)    APPearance CLOUDY (*)    Hgb urine dipstick LARGE (*)    Protein, ur 100 (*)    Leukocytes, UA LARGE (*)    Squamous Epithelial / LPF 0-5 (*)    Non Squamous Epithelial 0-5 (*)    All other components within normal limits  URINE CULTURE  PREGNANCY, URINE    PROCEDURES  Procedure(s) performed: None  Procedures  Critical Care performed: No  ____________________________________________   INITIAL IMPRESSION / ASSESSMENT AND PLAN / ED COURSE  Pertinent labs & imaging results that were available during my care of the patient were reviewed by me and considered in my medical decision making (see chart for details).  Patient was started on Keflex 500 mg 3 times a day for 10 days and Zofran if needed for nausea. Patient will increase fluids. She'll follow-up with her PCP if any continued problems. Tylenol as needed for fever. A culture was added to her lab work.    ___________________________________________   FINAL CLINICAL IMPRESSION(S) / ED DIAGNOSES  Final diagnoses:  Acute cystitis with hematuria      NEW MEDICATIONS STARTED DURING THIS VISIT:  Discharge Medication List as of 09/24/2016 11:23 AM    START taking these medications   Details  cephALEXin (KEFLEX) 500 MG capsule Take 1 capsule (500 mg total) by mouth 3 (three) times daily., Starting Tue 09/24/2016, Print    ondansetron (ZOFRAN ODT) 4 MG disintegrating tablet Take 1 tablet (4 mg total) by mouth every 8 (eight) hours as needed for nausea or vomiting., Starting Tue 09/24/2016, Print         Note:  This document was prepared using Dragon voice recognition software and may include unintentional dictation errors.    Tommi RumpsSummers,  Rhonda L, PA-C 09/24/16 1501    Nita SickleVeronese, [place], MD 09/24/16 [phone]

## 2016-09-24 NOTE — ED Triage Notes (Signed)
Pt c/o painful urination for the past couple of days. With a Hx of UTI

## 2016-09-24 NOTE — ED Notes (Signed)
AAOx3.  Skin warm and dry.  NAD 

## 2016-09-24 NOTE — ED Notes (Signed)
See triage note  presents with lower back pain which started couple of days ago  Then noticed some urinary freg and pain this am  States she is only voiding small amts

## 2016-09-24 NOTE — Discharge Instructions (Signed)
Follow-up with your primary care provider if any continued problems and also for recheck of your blood pressure blood pressure in triage was elevated at 132/104.. Increase fluids. Zofran if needed for nausea and Keflex 500 mg 3 times a day for 10 days.

## 2016-09-25 LAB — URINE CULTURE: SPECIAL REQUESTS: NORMAL

## 2016-11-10 ENCOUNTER — Emergency Department
Admission: EM | Admit: 2016-11-10 | Discharge: 2016-11-10 | Disposition: A | Discharge status: Home / Self Care | Payer: Medicaid Other | Attending: Emergency Medicine | Admitting: Emergency Medicine

## 2016-11-10 ENCOUNTER — Encounter: Payer: Self-pay | Admitting: Emergency Medicine

## 2016-11-10 DIAGNOSIS — F17210 Nicotine dependence, cigarettes, uncomplicated: Secondary | ICD-10-CM | POA: Insufficient documentation

## 2016-11-10 DIAGNOSIS — J0100 Acute maxillary sinusitis, unspecified: Secondary | ICD-10-CM | POA: Insufficient documentation

## 2016-11-10 DIAGNOSIS — Z79899 Other long term (current) drug therapy: Secondary | ICD-10-CM | POA: Insufficient documentation

## 2016-11-10 DIAGNOSIS — H9202 Otalgia, left ear: Secondary | ICD-10-CM | POA: Diagnosis present

## 2016-11-10 MED ORDER — AMOXICILLIN-POT CLAVULANATE 875-125 MG PO TABS: 1.0000 | ORAL_TABLET | Freq: Two times a day (BID) | ORAL | 0 refills | Status: DC

## 2016-11-10 MED ORDER — AMOXICILLIN-POT CLAVULANATE 875-125 MG PO TABS
1.0000 | ORAL_TABLET | Freq: Once | ORAL | Status: AC
  Administered 2016-11-10: 1 via ORAL
  Filled 2016-11-10: qty 1

## 2016-11-10 MED ORDER — FLUTICASONE PROPIONATE 50 MCG/ACT NA SUSP: 1.0000 | Freq: Two times a day (BID) | NASAL | 0 refills | Status: DC

## 2016-11-10 NOTE — ED Notes (Signed)
Patient c/o left jaw and ear pain beginning Monday. Patient reports yellow mucous/drainage. [facility] Am[facility]. Prescription filled on 10/19. Patient reports no relief of symptoms with antibiotics.

## 2016-11-10 NOTE — ED Provider Notes (Signed)
[facility] Emergency Department Provider Note  ____________________________________________  Time seen: Approximately 10:16 PM  I have reviewed the triage vital signs and the nursing notes.   HISTORY  Chief Complaint Otalgia    HPI Debra Wang is a 41 y.o. female who presents emergency department complaining of left-sided sinus pain, left ear pain, left throat pain. Patient reports that she has had increasing symptoms over the last week. She was seen for similar symptoms 2 days prior at [facility]. Patient was given a prescription for 500 mg amoxicillin twice a day. Patient reports that symptoms have continued to increase. No fevers or chills, headache, visual changes, chest pain, coughing, shortness of breath, abdominal pain, nausea or vomiting. Patient has not been using any other prescription or over-the-counter medication besides amoxicillin. No other complaints at this time.   Past Medical History:  Diagnosis Date  . Anxiety   . Anxiety   . Depression   . Depression   . Hemorrhoid   . UTI (lower urinary tract infection)   . UTI (lower urinary tract infection)     Patient Active Problem List   Diagnosis Date Noted  . Frequent UTI 07/22/2013  . Clinical depression 04/24/2010    Past Surgical History:  Procedure Laterality Date  . TUBAL LIGATION  2009  . TUBAL LIGATION      Prior to Admission medications   Medication Sig Start Date End Date Taking? Authorizing Provider  amoxicillin (AMOXIL) 500 MG capsule Take 500 mg by mouth 3 (three) times daily.   Yes [provider]  citalopram (CELEXA) 40 MG tablet Take 40 mg by mouth daily.   Yes [provider]  clonazePAM (KLONOPIN) 0.5 MG tablet Take 0.5 mg by mouth at bedtime.   Yes [provider]  ibuprofen (ADVIL,MOTRIN) 400 MG tablet Take 1 tablet (400 mg total) by mouth every 6 (six) hours as needed. 12/15/15  Yes Jennye Moccasin, MD  ondansetron (ZOFRAN ODT)  4 MG disintegrating tablet Take 1 tablet (4 mg total) by mouth every 8 (eight) hours as needed for nausea or vomiting. 09/24/16  Yes Bridget Hartshorn L, PA-C  pantoprazole (PROTONIX) 20 MG tablet Take 1 tablet (20 mg total) by mouth daily. 08/27/16 08/27/17 Yes Jene Every, MD  traZODone (DESYREL) 50 MG tablet Take 50 mg by mouth at bedtime. prn   Yes [provider]  amoxicillin-clavulanate (AUGMENTIN) 875-125 MG tablet Take 1 tablet by mouth 2 (two) times daily. 11/10/16   Cuthriell, Delorise Royals, PA-C  fluticasone (FLONASE) 50 MCG/ACT nasal spray Place 1 spray into both nostrils 2 (two) times daily. 11/10/16   Cuthriell, Delorise Royals, PA-C    Allergies Benadryl [diphenhydramine hcl (sleep)]; Diphenhydramine; Sulfa antibiotics; and Sulfur  History reviewed. No pertinent family history.  Social History Social History  Substance Use Topics  . Smoking status: Current Every Day Smoker    Packs/day: 0.50    Types: Cigarettes  . Smokeless tobacco: Never Used  . Alcohol use No     Review of Systems  Constitutional: No fever/chills Eyes: No visual changes. No discharge ENT: Positive for nasal congestion, sinus pressure, left ear pain, left-sided sore throat. Cardiovascular: no chest pain. Respiratory: no cough. No SOB. Gastrointestinal: No abdominal pain.  No nausea, no vomiting.  No diarrhea.  No constipation. Musculoskeletal: Negative for musculoskeletal pain. Skin: Negative for rash, abrasions, lacerations, ecchymosis. Neurological: Negative for headaches, focal weakness or numbness. 10-point ROS otherwise negative.  ____________________________________________   PHYSICAL EXAM:  VITAL SIGNS:  ED Triage Vitals  Enc Vitals Group     BP 11/10/16 2134 (!) 168/104     Pulse Rate 11/10/16 2134 87     Resp 11/10/16 2134 18     Temp 11/10/16 2134 97.7 F (36.5 C)     Temp Source 11/10/16 2134 Oral     SpO2 11/10/16 2134 98 %     Weight 11/10/16 2135 160 lb (72.6 kg)     Height  11/10/16 2135 5' 5.5" (1.664 m)     Head Circumference --      Peak Flow --      Pain Score 11/10/16 2134 10     Pain Loc --      Pain Edu? --      Excl. in GC? --      Constitutional: Alert and oriented. Well appearing and in no acute distress. Eyes: Conjunctivae are normal. PERRL. EOMI. Head: Atraumatic. ENT:      Ears: EACs unremarkable bilaterally. TM on left is bulging, without air-fluid level      Nose: Moderate purulent congestion/rhinnorhea. Patient is exquisitely tender to percussion over the left maxillary sinuses. Nontender over the right.      Mouth/Throat: Mucous membranes are moist.  Neck: No stridor.  No cervical spine tenderness to palpation Hematological/Lymphatic/Immunilogical: Scattered anterior cervical lymphadenopathy. Cardiovascular: Normal rate, regular rhythm. Normal S1 and S2.  Good peripheral circulation. Respiratory: Normal respiratory effort without tachypnea or retractions. Lungs CTAB. Good air entry to the bases with no decreased or absent breath sounds. Musculoskeletal: Full range of motion to all extremities. No gross deformities appreciated. Neurologic:  Normal speech and language. No gross focal neurologic deficits are appreciated.  Skin:  Skin is warm, dry and intact. No rash noted. Psychiatric: Mood and affect are normal. Speech and behavior are normal. Patient exhibits appropriate insight and judgement.   ____________________________________________   LABS (all labs ordered are listed, but only abnormal results are displayed)  Labs Reviewed - No data to display ____________________________________________  EKG   ____________________________________________  RADIOLOGY   No results found.  ____________________________________________    PROCEDURES  Procedure(s) performed:    Procedures    Medications  amoxicillin-clavulanate (AUGMENTIN) 875-125 MG per tablet 1 tablet (not administered)      ____________________________________________   INITIAL IMPRESSION / ASSESSMENT AND PLAN / ED COURSE  Pertinent labs & imaging results that were available during my care of the patient were reviewed by me and considered in my medical decision making (see chart for details).  Review of the [place] CSRS was performed in accordance of the [facility] prior to dispensing any controlled drugs.     Patient's diagnosis is consistent with acute maxillary sinusitis. Patient been given 500 mg amoxicillin twice a day, this is ineffective dosing for patient's acute sinusitis. Patient will be switched to Augmentin and placed on Flonase. Tylenol and Motrin at home as needed for pain.. Patient follow-up primary care as needed. Patient is given ED precautions to return to the ED for any worsening or new symptoms.     ____________________________________________  FINAL CLINICAL IMPRESSION(S) / ED DIAGNOSES  Final diagnoses:  Acute non-recurrent maxillary sinusitis      NEW MEDICATIONS STARTED DURING THIS VISIT:  New Prescriptions   AMOXICILLIN-CLAVULANATE (AUGMENTIN) 875-125 MG TABLET    Take 1 tablet by mouth 2 (two) times daily.   FLUTICASONE (FLONASE) 50 MCG/ACT NASAL SPRAY    Place 1 spray into both nostrils 2 (two) times daily.        This  chart was dictated using voice recognition software/Dragon. Despite best efforts to proofread, errors can occur which can change the meaning. Any change was purely unintentional.    Racheal Patches, PA-C 11/10/16 2230    Sharyn Creamer, MD 11/11/16 [phone]

## 2016-11-10 NOTE — ED Triage Notes (Signed)
Pt c/o left earache, sore throat and tongue pain for one week; [facility] and prescribed Amoxicillin for otalgia; pt says she's taking as prescribed but is not feeling any better

## 2016-11-10 NOTE — ED Notes (Signed)
Reviewed d/c instructions, follow-up care, prescriptions with patient. Patient verbalized understanding.  

## 2017-01-12 ENCOUNTER — Emergency Department
Admission: EM | Admit: 2017-01-12 | Discharge: 2017-01-12 | Disposition: A | Discharge status: Home / Self Care | Payer: Medicaid Other | Attending: Emergency Medicine | Admitting: Emergency Medicine

## 2017-01-12 ENCOUNTER — Encounter: Payer: Self-pay | Admitting: Emergency Medicine

## 2017-01-12 ENCOUNTER — Other Ambulatory Visit: Payer: Self-pay

## 2017-01-12 DIAGNOSIS — S99911A Unspecified injury of right ankle, initial encounter: Secondary | ICD-10-CM | POA: Diagnosis present

## 2017-01-12 DIAGNOSIS — S91011A Laceration without foreign body, right ankle, initial encounter: Secondary | ICD-10-CM | POA: Insufficient documentation

## 2017-01-12 DIAGNOSIS — S91311A Laceration without foreign body, right foot, initial encounter: Secondary | ICD-10-CM

## 2017-01-12 DIAGNOSIS — Y93F9 Activity, other caregiving: Secondary | ICD-10-CM | POA: Insufficient documentation

## 2017-01-12 DIAGNOSIS — Y999 Unspecified external cause status: Secondary | ICD-10-CM | POA: Insufficient documentation

## 2017-01-12 DIAGNOSIS — Y929 Unspecified place or not applicable: Secondary | ICD-10-CM | POA: Insufficient documentation

## 2017-01-12 DIAGNOSIS — W25XXXA Contact with sharp glass, initial encounter: Secondary | ICD-10-CM | POA: Diagnosis not present

## 2017-01-12 DIAGNOSIS — Z79899 Other long term (current) drug therapy: Secondary | ICD-10-CM | POA: Diagnosis not present

## 2017-01-12 DIAGNOSIS — F17210 Nicotine dependence, cigarettes, uncomplicated: Secondary | ICD-10-CM | POA: Insufficient documentation

## 2017-01-12 MED ORDER — LIDOCAINE HCL 1 % IJ SOLN
5.0000 mL | Freq: Once | INTRAMUSCULAR | Status: AC
  Administered 2017-01-12: 5 mL
  Filled 2017-01-12: qty 5

## 2017-01-12 MED ORDER — CEPHALEXIN 500 MG PO CAPS: 500.0000 mg | ORAL_CAPSULE | Freq: Three times a day (TID) | ORAL | 0 refills | Status: AC

## 2017-01-12 MED ORDER — FLUCONAZOLE 150 MG PO TABS: 150.0000 mg | ORAL_TABLET | Freq: Every day | ORAL | 1 refills | Status: AC

## 2017-01-12 MED ORDER — LIDOCAINE HCL (PF) 1 % IJ SOLN
INTRAMUSCULAR | Status: AC
  Administered 2017-01-12: 5 mL
  Filled 2017-01-12: qty 5

## 2017-01-12 NOTE — ED Triage Notes (Signed)
Presents with laceration to back of right foot

## 2017-01-12 NOTE — ED Provider Notes (Signed)
[facility] Emergency Department Provider Note  ____________________________________________  Time seen: Approximately 3:54 PM  I have reviewed the triage vital signs and the nursing notes.   HISTORY  Chief Complaint Laceration    HPI Debra Wang is a 41 y.o. female presents to the emergency department with a 3 cm right posterior ankle laceration sustained with a piece of glass after helping an elderly neighbor.  Patient did not fall.  No weakness, radiculopathy or changes in sensation of the lower extremities.  Tetanus status is up-to-date.   Past Medical History:  Diagnosis Date  . Anxiety   . Anxiety   . Depression   . Depression   . Hemorrhoid   . UTI (lower urinary tract infection)   . UTI (lower urinary tract infection)     Patient Active Problem List   Diagnosis Date Noted  . Frequent UTI 07/22/2013  . Clinical depression 04/24/2010    Past Surgical History:  Procedure Laterality Date  . TUBAL LIGATION  2009  . TUBAL LIGATION      Prior to Admission medications   Medication Sig Start Date End Date Taking? Authorizing Provider  amoxicillin (AMOXIL) 500 MG capsule Take 500 mg by mouth 3 (three) times daily.    [provider]  amoxicillin-clavulanate (AUGMENTIN) 875-125 MG tablet Take 1 tablet by mouth 2 (two) times daily. 11/10/16   Cuthriell, Delorise RoyalsJonathan D, PA-C  cephALEXin (KEFLEX) 500 MG capsule Take 1 capsule (500 mg total) by mouth 3 (three) times daily for 10 days. 01/12/17 01/22/17  Orvil FeilWoods, Hasel Janish M, PA-C  citalopram (CELEXA) 40 MG tablet Take 40 mg by mouth daily.    [provider]  clonazePAM (KLONOPIN) 0.5 MG tablet Take 0.5 mg by mouth at bedtime.    [provider]  fluconazole (DIFLUCAN) 150 MG tablet Take 1 tablet (150 mg total) by mouth daily for 1 day. 01/12/17 01/13/17  Orvil FeilWoods, Tamisha Nordstrom M, PA-C  fluticasone (FLONASE) 50 MCG/ACT nasal spray Place 1 spray into both nostrils 2 (two) times daily.  11/10/16   Cuthriell, Delorise RoyalsJonathan D, PA-C  ibuprofen (ADVIL,MOTRIN) 400 MG tablet Take 1 tablet (400 mg total) by mouth every 6 (six) hours as needed. 12/15/15   Jennye MoccasinQuigley, Brian S, MD  ondansetron (ZOFRAN ODT) 4 MG disintegrating tablet Take 1 tablet (4 mg total) by mouth every 8 (eight) hours as needed for nausea or vomiting. 09/24/16   Tommi RumpsSummers, Rhonda L, PA-C  pantoprazole (PROTONIX) 20 MG tablet Take 1 tablet (20 mg total) by mouth daily. 08/27/16 08/27/17  Jene EveryKinner, Robert, MD  traZODone (DESYREL) 50 MG tablet Take 50 mg by mouth at bedtime. prn    [provider]    Allergies Benadryl [diphenhydramine hcl (sleep)]; Diphenhydramine; Sulfa antibiotics; and Sulfur  No family history on file.  Social History Social History   Tobacco Use  . Smoking status: Current Every Day Smoker    Packs/day: 0.50    Types: Cigarettes  . Smokeless tobacco: Never Used  Substance Use Topics  . Alcohol use: No  . Drug use: No     Review of Systems  Constitutional: No fever/chills Eyes: No visual changes. No discharge ENT: No upper respiratory complaints. Cardiovascular: no chest pain. Respiratory: no cough. No SOB. Musculoskeletal: Negative for musculoskeletal pain. Skin: Patient has right posterior ankle laceration.  Neurological: Negative for headaches, focal weakness or numbness.   ____________________________________________   PHYSICAL EXAM:  VITAL SIGNS: ED Triage Vitals [01/12/17 1416]  Enc Vitals Group     BP  139/69     Pulse Rate 92     Resp 18     Temp 99 F (37.2 C)     Temp Source Oral     SpO2 97 %     Weight 150 lb (68 kg)     Height 5\' 5"  (1.651 m)     Head Circumference      Peak Flow      Pain Score      Pain Loc      Pain Edu?      Excl. in GC?      Constitutional: Alert and oriented. Well appearing and in no acute distress. Eyes: Conjunctivae are normal. PERRL. EOMI. Head: Atraumatic. Cardiovascular: Normal rate, regular rhythm. Normal S1 and S2.  Good  peripheral circulation. Respiratory: Normal respiratory effort without tachypnea or retractions. Lungs CTAB. Good air entry to the bases with no decreased or absent breath sounds. Musculoskeletal: Full range of motion to all extremities. No gross deformities appreciated. Neurologic:  Normal speech and language. No gross focal neurologic deficits are appreciated.  Skin: Patient has a 3 cm right posterior ankle laceration. Psychiatric: Mood and affect are normal. Speech and behavior are normal. Patient exhibits appropriate insight and judgement.   ____________________________________________   LABS (all labs ordered are listed, but only abnormal results are displayed)  Labs Reviewed - No data to display ____________________________________________  EKG   ____________________________________________  RADIOLOGY   No results found.  ____________________________________________    PROCEDURES  Procedure(s) performed:    Procedures  LACERATION REPAIR Performed by: Orvil Feil Authorized by: Orvil Feil Consent: Verbal consent obtained. Risks and benefits: risks, benefits and alternatives were discussed Consent given by: patient Patient identity confirmed: provided demographic data Prepped and Draped in normal sterile fashion Wound explored  Laceration Location: Right ankle   Laceration Length: 3 cm  No Foreign Bodies seen or palpated  Anesthesia: local infiltration  Local anesthetic: lidocaine 1% without epinephrine  Anesthetic total: 5 ml  Irrigation method: syringe Amount of cleaning: standard  Skin closure: 4-0 Ethilon   Number of sutures: 6  Technique: Simple Interrupted   Patient tolerance: Patient tolerated the procedure well with no immediate complications.   Medications  lidocaine (XYLOCAINE) 1 % (with pres) injection 5 mL (5 mLs Infiltration Given 01/12/17 1515)     ____________________________________________   INITIAL IMPRESSION  / ASSESSMENT AND PLAN / ED COURSE  Pertinent labs & imaging results that were available during my care of the patient were reviewed by me and considered in my medical decision making (see chart for details).  Review of the [place] CSRS was performed in accordance of the [facility] prior to dispensing any controlled drugs.     Assessment and plan Right ankle laceration Patient presents to the emergency department with a 3 cm right posterior ankle laceration.  Patient tolerated laceration repair with suture without complications.  She was advised to have sutures removed by primary care in 9 days.  Vital signs are reassuring prior to discharge.  All patient questions were answered.   ____________________________________________  FINAL CLINICAL IMPRESSION(S) / ED DIAGNOSES  Final diagnoses:  Foot laceration, right, initial encounter      NEW MEDICATIONS STARTED DURING THIS VISIT:  ED Discharge Orders        Ordered    cephALEXin (KEFLEX) 500 MG capsule  3 times daily     01/12/17 1538    fluconazole (DIFLUCAN) 150 MG tablet  Daily     01/12/17 1539  This chart was dictated using voice recognition software/Dragon. Despite best efforts to proofread, errors can occur which can change the meaning. Any change was purely unintentional.    Orvil FeilWoods, Jaydence Arnesen M, PA-C 01/12/17 1600    Phineas SemenGoodman, Graydon, MD 01/12/17 [id number]

## 2017-02-22 ENCOUNTER — Encounter: Payer: Self-pay | Admitting: Emergency Medicine

## 2017-02-22 ENCOUNTER — Emergency Department
Admission: EM | Admit: 2017-02-22 | Discharge: 2017-02-22 | Disposition: A | Discharge status: Home / Self Care | Payer: Medicaid Other | Attending: Emergency Medicine | Admitting: Emergency Medicine

## 2017-02-22 ENCOUNTER — Other Ambulatory Visit: Payer: Self-pay

## 2017-02-22 DIAGNOSIS — R112 Nausea with vomiting, unspecified: Secondary | ICD-10-CM | POA: Insufficient documentation

## 2017-02-22 DIAGNOSIS — Z79899 Other long term (current) drug therapy: Secondary | ICD-10-CM | POA: Insufficient documentation

## 2017-02-22 DIAGNOSIS — F17210 Nicotine dependence, cigarettes, uncomplicated: Secondary | ICD-10-CM | POA: Diagnosis not present

## 2017-02-22 DIAGNOSIS — R197 Diarrhea, unspecified: Secondary | ICD-10-CM | POA: Diagnosis not present

## 2017-02-22 MED ORDER — ONDANSETRON 4 MG PO TBDP: 4.0000 mg | ORAL_TABLET | Freq: Three times a day (TID) | ORAL | 0 refills | Status: AC | PRN

## 2017-02-22 MED ORDER — ONDANSETRON 4 MG PO TBDP
4.0000 mg | ORAL_TABLET | Freq: Once | ORAL | Status: AC
  Administered 2017-02-22: 4 mg via ORAL
  Filled 2017-02-22: qty 1

## 2017-02-22 NOTE — ED Provider Notes (Signed)
[facility] Emergency Department Provider Note  Time seen: 7:40 AM  I have reviewed the triage vital signs and the nursing notes.   HISTORY  Chief Complaint Abdominal Pain and Facial Pain    HPI Debra Wang is a 42 y.o. female with a past medical history of anxiety, presents to the emergency department for nausea vomiting diarrhea and body aches.  According to the patient for the past 2 days she has had some mild throat and sinus congestion.  Denies any fever.  Denies any cough.  States this morning she awoke with nausea and diarrhea.  Attempted to go to work but began vomiting at work so she left work and came to the emergency department.  Denies any fever.  She does state body aches today.  Denies any sick contacts at home.  Denies any abdominal pain.  Denies any chest pain.   Past Medical History:  Diagnosis Date  . Anxiety   . Anxiety   . Depression   . Depression   . Hemorrhoid   . UTI (lower urinary tract infection)   . UTI (lower urinary tract infection)     Patient Active Problem List   Diagnosis Date Noted  . Frequent UTI 07/22/2013  . Clinical depression 04/24/2010    Past Surgical History:  Procedure Laterality Date  . TUBAL LIGATION  2009  . TUBAL LIGATION      Prior to Admission medications   Medication Sig Start Date End Date Taking? Authorizing Provider  amoxicillin (AMOXIL) 500 MG capsule Take 500 mg by mouth 3 (three) times daily.    [provider]  amoxicillin-clavulanate (AUGMENTIN) 875-125 MG tablet Take 1 tablet by mouth 2 (two) times daily. 11/10/16   Cuthriell, Delorise Royals, PA-C  citalopram (CELEXA) 40 MG tablet Take 40 mg by mouth daily.    [provider]  clonazePAM (KLONOPIN) 0.5 MG tablet Take 0.5 mg by mouth at bedtime.    [provider]  fluticasone (FLONASE) 50 MCG/ACT nasal spray Place 1 spray into both nostrils 2 (two) times daily. 11/10/16   Cuthriell, Delorise Royals, PA-C  ibuprofen  (ADVIL,MOTRIN) 400 MG tablet Take 1 tablet (400 mg total) by mouth every 6 (six) hours as needed. 12/15/15   Jennye Moccasin, MD  ondansetron (ZOFRAN ODT) 4 MG disintegrating tablet Take 1 tablet (4 mg total) by mouth every 8 (eight) hours as needed for nausea or vomiting. 09/24/16   Tommi Rumps, PA-C  pantoprazole (PROTONIX) 20 MG tablet Take 1 tablet (20 mg total) by mouth daily. 08/27/16 08/27/17  Jene Every, MD  traZODone (DESYREL) 50 MG tablet Take 50 mg by mouth at bedtime. prn    [provider]    Allergies  Allergen Reactions  . Benadryl [Diphenhydramine Hcl (Sleep)]     Seizures   . Diphenhydramine Other (See Comments)    Sort of like a seizure, but not a seizure, disoriented  . Sulfa Antibiotics Nausea And Vomiting and Rash  . Sulfur Itching    No family history on file.  Social History Social History   Tobacco Use  . Smoking status: Current Every Day Smoker    Packs/day: 0.50    Types: Cigarettes  . Smokeless tobacco: Never Used  Substance Use Topics  . Alcohol use: No  . Drug use: No    Review of Systems Constitutional: Negative for fever. Eyes: Negative for visual complaints ENT: Mild congestion. Cardiovascular: Negative for chest pain. Respiratory: Negative for shortness of breath.  Negative for cough. Gastrointestinal: Negative for abdominal pain.  Positive for nausea vomiting and diarrhea this morning. Genitourinary: Negative for urinary compaints Musculoskeletal: Negative for musculoskeletal complaints Skin: Negative for skin complaints  Neurological: Negative for headache All other ROS negative  ____________________________________________   PHYSICAL EXAM:  VITAL SIGNS: ED Triage Vitals [02/22/17 0714]  Enc Vitals Group     BP 118/82     Pulse Rate 82     Resp 18     Temp 98.3 F (36.8 C)     Temp Source Oral     SpO2 99 %     Weight 170 lb (77.1 kg)     Height 5' 5.5" (1.664 m)     Head Circumference      Peak Flow       Pain Score 8     Pain Loc      Pain Edu?      Excl. in GC?    Constitutional: Alert and oriented. Well appearing and in no distress. Eyes: Normal exam ENT   Head: Normocephalic and atraumatic.   Mouth/Throat: Mucous membranes are moist. Cardiovascular: Normal rate, regular rhythm. No murmur Respiratory: Normal respiratory effort without tachypnea nor retractions. Breath sounds are clear  Gastrointestinal: Soft and nontender. No distention.  Musculoskeletal: Nontender with normal range of motion in all extremities. No lower extremity tenderness or edema. Neurologic:  Normal speech and language. No gross focal neurologic deficits Skin:  Skin is warm, dry and intact.  Psychiatric: Mood and affect are normal.   ____________________________________________   INITIAL IMPRESSION / ASSESSMENT AND PLAN / ED COURSE  Pertinent labs & imaging results that were available during my care of the patient were reviewed by me and considered in my medical decision making (see chart for details).  Patient presents to the emergency department for nausea vomiting and diarrhea along with body aches and congestion.  According to the patient she has had congestion for 1-2 days, body aches nausea vomiting and diarrhea starting this morning.  Patient states she attempted to go to work but cannot stay due to the vomiting and diarrhea.  Denies any chest pain or abdominal pain.  States she believes all she needs to do is rest and would like something for nausea, is requesting a work note.  I discussed with the patient workup including blood work and a flu test versus symptomatic treatment and follow-up if symptoms worsen.  Patient would prefer symptomatic treatment and wishes to go home so she can rest.  I discussed very strict return precautions for any development of abdominal pain or fever she is to return to the emergency department for further workup including lab work and possible influenza testing.  Patient  agreeable to this plan of care.  We will dose Zofran x1 in the emergency department and discharged with a prescription.  I discussed over-the-counter medications such as Imodium if needed for diarrhea Tylenol or ibuprofen if needed for low-grade fever/discomfort.  Differential this time would include gastroenteritis, influenza, viral illness, less likely acute bacterial infection.  ____________________________________________   FINAL CLINICAL IMPRESSION(S) / ED DIAGNOSES  Nausea vomiting diarrhea    Minna AntisPaduchowski, Carrissa Taitano, MD 02/22/17 [phone]

## 2017-02-22 NOTE — ED Triage Notes (Signed)
Abdominal pain with vomiting x2 this am. Sinus congestion with clear drainage x 2 days. Diarrhea began this am.

## 2017-02-22 NOTE — ED Notes (Signed)
Pt started with generalized body aches, 2 episode vomiting today, cough, congestion and sore throat.  Reports few episodes of diarrhea as well. No fevers.  Only pain is the body aches and throat.

## 2017-02-22 NOTE — Discharge Instructions (Signed)
As we discussed please take your nausea medication as needed, as prescribed.  Please use Tylenol ibuprofen as needed for low-grade fever/discomfort.  Return to the emergency department for any significant worsening of symptoms, development of abdominal pain, or fever.  Otherwise please follow-up with your doctor in 1-2 days for recheck/reevaluation.

## 2017-04-24 ENCOUNTER — Other Ambulatory Visit: Payer: Self-pay | Admitting: Family Medicine

## 2017-04-24 DIAGNOSIS — Z1231 Encounter for screening mammogram for malignant neoplasm of breast: Secondary | ICD-10-CM

## 2017-05-16 ENCOUNTER — Ambulatory Visit
Admission: RE | Admit: 2017-05-16 | Discharge: 2017-05-16 | Disposition: A | Discharge status: Home / Self Care | Payer: Medicaid Other | Source: Ambulatory Visit | Attending: Family Medicine | Admitting: Family Medicine

## 2017-05-16 DIAGNOSIS — Z1231 Encounter for screening mammogram for malignant neoplasm of breast: Secondary | ICD-10-CM | POA: Insufficient documentation

## 2017-05-19 ENCOUNTER — Ambulatory Visit
Admission: RE | Admit: 2017-05-19 | Discharge: 2017-05-19 | Disposition: A | Discharge status: Home / Self Care | Payer: Medicaid Other | Source: Ambulatory Visit | Attending: Family Medicine | Admitting: Family Medicine

## 2017-05-19 ENCOUNTER — Other Ambulatory Visit: Payer: Self-pay | Admitting: Family Medicine

## 2017-05-19 DIAGNOSIS — M79641 Pain in right hand: Secondary | ICD-10-CM | POA: Insufficient documentation

## 2017-05-27 ENCOUNTER — Encounter: Payer: Self-pay | Admitting: Obstetrics and Gynecology

## 2017-05-27 ENCOUNTER — Ambulatory Visit: Payer: Medicaid Other | Admitting: Obstetrics and Gynecology

## 2017-05-27 VITALS — BP 118/72 | HR 83 | Ht 65.5 in | Wt 176.4 lb

## 2017-05-27 DIAGNOSIS — N920 Excessive and frequent menstruation with regular cycle: Secondary | ICD-10-CM

## 2017-05-27 DIAGNOSIS — Z720 Tobacco use: Secondary | ICD-10-CM

## 2017-05-27 DIAGNOSIS — N946 Dysmenorrhea, unspecified: Secondary | ICD-10-CM

## 2017-05-27 NOTE — Progress Notes (Signed)
HPI:      [name] is a 42 y.o. [id number] who LMP was Patient's last menstrual period was 05/02/2017.  Subjective:   She presents today with complaint of heavy menstrual bleeding for 7 to 8 days/month with significant pain at the time of her menses.  She also complains of intermittent pelvic pain on her left side.  She also has occasional pain with intercourse on her left side.  The left-sided pain has been present since her tubal ligation 9 years ago. She feels like her menstrual periods are getting heavier and more painful despite constant use of ibuprofen during that week.    She recently had a pelvic ultrasound.    Hx: The following portions of the patient's history were reviewed and updated as appropriate:             She  has a past medical history of Anxiety, Anxiety, Depression, Depression, Hemorrhoid, UTI (lower urinary tract infection), and UTI (lower urinary tract infection). She does not have any pertinent problems on file. She  has a past surgical history that includes Tubal ligation (2009) and Tubal ligation. Her family history includes Breast cancer in her other; Colon cancer in her father; Diabetes in her mother; Ovarian cancer in her other. She  reports that she has been smoking cigarettes.  She has been smoking about 0.50 packs per day. She has never used smokeless tobacco. She reports that she does not drink alcohol or use drugs. She has a current medication list which includes the following prescription(s): citalopram, clonazepam, fluticasone, ibuprofen, ondansetron, pantoprazole, and trazodone. She is allergic to benadryl [diphenhydramine hcl (sleep)]; diphenhydramine; sulfa antibiotics; and sulfur.       Review of Systems:  Review of Systems  Constitutional: Denied constitutional symptoms, night sweats, recent illness, fatigue, fever, insomnia and weight loss.  Eyes: Denied eye symptoms, eye pain, photophobia, vision change and visual disturbance.   Ears/Nose/Throat/Neck: Denied ear, nose, throat or neck symptoms, hearing loss, nasal discharge, sinus congestion and sore throat.  Cardiovascular: Denied cardiovascular symptoms, arrhythmia, chest pain/pressure, edema, exercise intolerance, orthopnea and palpitations.  Respiratory: Denied pulmonary symptoms, asthma, pleuritic pain, productive sputum, cough, dyspnea and wheezing.  Gastrointestinal: Denied, gastro-esophageal reflux, melena, nausea and vomiting.  Genitourinary: See HPI for additional information.  Musculoskeletal: Denied musculoskeletal symptoms, stiffness, swelling, muscle weakness and myalgia.  Dermatologic: Denied dermatology symptoms, rash and scar.  Neurologic: Denied neurology symptoms, dizziness, headache, neck pain and syncope.  Psychiatric: Denied psychiatric symptoms, anxiety and depression.  Endocrine: Denied endocrine symptoms including hot flashes and night sweats.   Meds:   Current Outpatient Medications on File Prior to Visit  Medication Sig Dispense Refill  . citalopram (CELEXA) 40 MG tablet Take 40 mg by mouth daily.    . clonazePAM (KLONOPIN) 0.5 MG tablet Take 0.5 mg by mouth at bedtime.    . fluticasone (FLONASE) 50 MCG/ACT nasal spray Place 1 spray into both nostrils 2 (two) times daily. 16 g 0  . ibuprofen (ADVIL,MOTRIN) 400 MG tablet Take 1 tablet (400 mg total) by mouth every 6 (six) hours as needed. 30 tablet 0  . ondansetron (ZOFRAN ODT) 4 MG disintegrating tablet Take 1 tablet (4 mg total) by mouth every 8 (eight) hours as needed for nausea or vomiting. 20 tablet 0  . pantoprazole (PROTONIX) 20 MG tablet Take 1 tablet (20 mg total) by mouth daily. 30 tablet 1  . traZODone (DESYREL) 50 MG tablet Take 50 mg by mouth at bedtime. prn  No current facility-administered medications on file prior to visit.     Objective:     Vitals:   05/27/17 1446  BP: 118/72  Pulse: 83              Ultrasound report reviewed directly with the  patient.  Assessment:    [id number] Patient Active Problem List   Diagnosis Date Noted  . Frequent UTI 07/22/2013  . Clinical depression 04/24/2010     1. Menorrhagia with regular cycle   2. Dysmenorrhea   3. Tobacco abuse     Based on her history and the appearance of the uterus it seems likely that she has adenomyosis.  It is possible that her left-sided pain is secondary to adhesions from her tubal ligation.   Plan:            1.  Will attempt to control menstrual periods with IUD thus alleviating some of her dysmenorrhea as well.  2.  Should she fail IUD would consider hysterectomy with the diagnosis of adenomyosis in mind. Orders No orders of the defined types were placed in this encounter.   No orders of the defined types were placed in this encounter.     F/U  Return for She is to call at the start of next menses. I spent 32 minutes with this patient of which greater than 50% was spent discussing her previous ultrasound findings, her history of menstrual periods, history of pelvic pain, treatment and work-up going forward for her pelvic pain and dysmenorrhea menorrhagia.  Multiple treatment strategies discussed including possible surgery.  All of her questions were answered.  Elonda Husky, M.D. 05/27/2017 4:23 PM

## 2017-06-12 ENCOUNTER — Encounter: Payer: Self-pay | Admitting: Obstetrics and Gynecology

## 2017-06-12 ENCOUNTER — Ambulatory Visit: Payer: Medicaid Other | Admitting: Obstetrics and Gynecology

## 2017-06-12 VITALS — BP 145/93 | HR 98 | Ht 65.0 in | Wt 177.0 lb

## 2017-06-12 DIAGNOSIS — Z30430 Encounter for insertion of intrauterine contraceptive device: Secondary | ICD-10-CM

## 2017-06-12 DIAGNOSIS — N946 Dysmenorrhea, unspecified: Secondary | ICD-10-CM

## 2017-06-12 DIAGNOSIS — N920 Excessive and frequent menstruation with regular cycle: Secondary | ICD-10-CM

## 2017-06-12 NOTE — Progress Notes (Signed)
HPI:      [name] is a 41 y.o. [id number] who LMP was Patient's last menstrual period was 06/09/2017 (exact date).  Subjective:   She presents today for IUD insertion.  She is currently on her menstrual period as directed.  She is getting an IUD for dysmenorrhea and menorrhagia.    Hx: The following portions of the patient's history were reviewed and updated as appropriate:             She  has a past medical history of Anxiety, Anxiety, Depression, Depression, Hemorrhoid, UTI (lower urinary tract infection), and UTI (lower urinary tract infection). She does not have any pertinent problems on file. She  has a past surgical history that includes Tubal ligation (2009) and Tubal ligation. Her family history includes Breast cancer in her other; Colon cancer in her father; Diabetes in her mother; Ovarian cancer in her other. She  reports that she has been smoking cigarettes.  She has been smoking about 0.50 packs per day. She has never used smokeless tobacco. She reports that she does not drink alcohol or use drugs. She has a current medication list which includes the following prescription(s): citalopram, clonazepam, fluticasone, ibuprofen, ondansetron, pantoprazole, and trazodone. She is allergic to benadryl [diphenhydramine hcl (sleep)]; diphenhydramine; sulfa antibiotics; and sulfur.       Review of Systems:  Review of Systems  Constitutional: Denied constitutional symptoms, night sweats, recent illness, fatigue, fever, insomnia and weight loss.  Eyes: Denied eye symptoms, eye pain, photophobia, vision change and visual disturbance.  Ears/Nose/Throat/Neck: Denied ear, nose, throat or neck symptoms, hearing loss, nasal discharge, sinus congestion and sore throat.  Cardiovascular: Denied cardiovascular symptoms, arrhythmia, chest pain/pressure, edema, exercise intolerance, orthopnea and palpitations.  Respiratory: Denied pulmonary symptoms, asthma, pleuritic pain, productive sputum,  cough, dyspnea and wheezing.  Gastrointestinal: Denied, gastro-esophageal reflux, melena, nausea and vomiting.  Genitourinary: See HPI for additional information.  Musculoskeletal: Denied musculoskeletal symptoms, stiffness, swelling, muscle weakness and myalgia.  Dermatologic: Denied dermatology symptoms, rash and scar.  Neurologic: Denied neurology symptoms, dizziness, headache, neck pain and syncope.  Psychiatric: Denied psychiatric symptoms, anxiety and depression.  Endocrine: Denied endocrine symptoms including hot flashes and night sweats.   Meds:   Current Outpatient Medications on File Prior to Visit  Medication Sig Dispense Refill  . citalopram (CELEXA) 40 MG tablet Take 40 mg by mouth daily.    . clonazePAM (KLONOPIN) 0.5 MG tablet Take 0.5 mg by mouth at bedtime.    . fluticasone (FLONASE) 50 MCG/ACT nasal spray Place 1 spray into both nostrils 2 (two) times daily. 16 g 0  . ibuprofen (ADVIL,MOTRIN) 400 MG tablet Take 1 tablet (400 mg total) by mouth every 6 (six) hours as needed. 30 tablet 0  . ondansetron (ZOFRAN ODT) 4 MG disintegrating tablet Take 1 tablet (4 mg total) by mouth every 8 (eight) hours as needed for nausea or vomiting. 20 tablet 0  . pantoprazole (PROTONIX) 20 MG tablet Take 1 tablet (20 mg total) by mouth daily. 30 tablet 1  . traZODone (DESYREL) 50 MG tablet Take 50 mg by mouth at bedtime. prn     No current facility-administered medications on file prior to visit.     Objective:     Vitals:   06/12/17 1553  BP: (!) 145/93  Pulse: 98    Physical examination   Pelvic:   Vulva: Normal appearance.  No lesions.  Vagina: No lesions or abnormalities noted.  Support: Normal pelvic support.  Urethra No  masses tenderness or scarring.  Meatus Normal size without lesions or prolapse.  Cervix: Normal appearance.  No lesions.  Anus: Normal exam.  No lesions.  Perineum: Normal exam.  No lesions.        Bimanual   Uterus: Normal size.  Non-tender.  Mobile.   AV.  Adnexae: No masses.  Non-tender to palpation.  Cul-de-sac: Negative for abnormality.   IUD Procedure Pt has read the booklet and signed the appropriate forms regarding the Mirena IUD.  All of her questions have been answered.   The cervix was cleansed with betadine solution.  After sounding the uterus and noting the position, the IUD was placed in the usual manner without problem.  The string was cut to the appropriate length.  The patient tolerated the procedure well.              Assessment:    [id number] Patient Active Problem List   Diagnosis Date Noted  . Frequent UTI 07/22/2013  . Clinical depression 04/24/2010     1. Encounter for insertion of mirena IUD   2. Menorrhagia with regular cycle   3. Dysmenorrhea       Plan:             F/U  Return in about 1 month (around 07/10/2017). I spent 18 minutes involved in the care of this patient of which greater than 50% was spent discussing IUD placement, expectations for bleeding, future management of bleeding if necessary  Elonda Husky, M.D. 06/12/2017 4:24 PM

## 2017-06-18 ENCOUNTER — Telehealth: Payer: Self-pay | Admitting: Obstetrics and Gynecology

## 2017-06-18 NOTE — Telephone Encounter (Signed)
The patient called and stated that she needs to speak with a nurse in regards to her IUD that was placed. Please advise.

## 2017-06-19 NOTE — Telephone Encounter (Signed)
lmtrc

## 2017-06-19 NOTE — Telephone Encounter (Signed)
Pt c/o of abd pain below belly button. Comes and goes. S/p iud insertion. Ibup helps. Spotting only. Advised pt to monitor. If sx gets worse or heavy vb develops to contact office.

## 2017-06-25 ENCOUNTER — Other Ambulatory Visit (INDEPENDENT_AMBULATORY_CARE_PROVIDER_SITE_OTHER): Payer: Medicaid Other

## 2017-06-25 ENCOUNTER — Ambulatory Visit
Admission: RE | Admit: 2017-06-25 | Discharge: 2017-06-25 | Disposition: A | Discharge status: Home / Self Care | Payer: Medicaid Other | Source: Ambulatory Visit | Attending: Family Medicine | Admitting: Family Medicine

## 2017-06-25 ENCOUNTER — Other Ambulatory Visit: Payer: Self-pay | Admitting: Obstetrics and Gynecology

## 2017-06-25 ENCOUNTER — Encounter: Payer: Self-pay | Admitting: Obstetrics and Gynecology

## 2017-06-25 ENCOUNTER — Ambulatory Visit: Payer: Medicaid Other | Admitting: Obstetrics and Gynecology

## 2017-06-25 ENCOUNTER — Other Ambulatory Visit: Payer: Self-pay | Admitting: Family Medicine

## 2017-06-25 VITALS — BP 142/95 | HR 97 | Ht 65.0 in | Wt 177.3 lb

## 2017-06-25 DIAGNOSIS — Z975 Presence of (intrauterine) contraceptive device: Secondary | ICD-10-CM | POA: Insufficient documentation

## 2017-06-25 DIAGNOSIS — Z30431 Encounter for routine checking of intrauterine contraceptive device: Secondary | ICD-10-CM | POA: Diagnosis not present

## 2017-06-25 DIAGNOSIS — R102 Pelvic and perineal pain: Secondary | ICD-10-CM

## 2017-06-25 DIAGNOSIS — M4317 Spondylolisthesis, lumbosacral region: Secondary | ICD-10-CM | POA: Diagnosis not present

## 2017-06-25 DIAGNOSIS — M545 Low back pain: Secondary | ICD-10-CM

## 2017-06-25 NOTE — Progress Notes (Signed)
HPI:      [name] is a 42 y.o. [id number] who LMP was Patient's last menstrual period was 06/09/2017 (exact date).  Subjective:   She presents today with complaint of pelvic cramping.  Says that her first week after IUD placement seemed fine but over the last several days has had increasing pelvic pain.  She reports occasional spotting but minimal bleeding.  She went to her family physician and a UA was performed which the patient states was "negative for bladder infection".    Hx: The following portions of the patient's history were reviewed and updated as appropriate:             She  has a past medical history of Anxiety, Anxiety, Depression, Depression, Hemorrhoid, UTI (lower urinary tract infection), and UTI (lower urinary tract infection). She does not have any pertinent problems on file. She  has a past surgical history that includes Tubal ligation (2009) and Tubal ligation. Her family history includes Breast cancer in her other; Colon cancer in her father; Diabetes in her mother; Ovarian cancer in her other. She  reports that she has been smoking cigarettes.  She has been smoking about 0.50 packs per day. She has never used smokeless tobacco. She reports that she does not drink alcohol or use drugs. She has a current medication list which includes the following prescription(s): cetirizine, citalopram, clonazepam, ezetimibe, fluticasone, ibuprofen, ondansetron, pantoprazole, topiramate, and trazodone. She is allergic to benadryl [diphenhydramine hcl (sleep)]; diphenhydramine; sulfa antibiotics; and sulfur.       Review of Systems:  Review of Systems  Constitutional: Denied constitutional symptoms, night sweats, recent illness, fatigue, fever, insomnia and weight loss.  Eyes: Denied eye symptoms, eye pain, photophobia, vision change and visual disturbance.  Ears/Nose/Throat/Neck: Denied ear, nose, throat or neck symptoms, hearing loss, nasal discharge, sinus congestion and sore  throat.  Cardiovascular: Denied cardiovascular symptoms, arrhythmia, chest pain/pressure, edema, exercise intolerance, orthopnea and palpitations.  Respiratory: Denied pulmonary symptoms, asthma, pleuritic pain, productive sputum, cough, dyspnea and wheezing.  Gastrointestinal: Denied, gastro-esophageal reflux, melena, nausea and vomiting.  Genitourinary: See HPI for additional information.  Musculoskeletal: Denied musculoskeletal symptoms, stiffness, swelling, muscle weakness and myalgia.  Dermatologic: Denied dermatology symptoms, rash and scar.  Neurologic: Denied neurology symptoms, dizziness, headache, neck pain and syncope.  Psychiatric: Denied psychiatric symptoms, anxiety and depression.  Endocrine: Denied endocrine symptoms including hot flashes and night sweats.   Meds:   Current Outpatient Medications on File Prior to Visit  Medication Sig Dispense Refill  . cetirizine (ZYRTEC) 10 MG tablet 1 TAB(S) ORAL EVERY EVENING  5  . citalopram (CELEXA) 40 MG tablet Take 40 mg by mouth daily.    . clonazePAM (KLONOPIN) 0.5 MG tablet Take 0.5 mg by mouth at bedtime.    Marland Kitchen ezetimibe (ZETIA) 10 MG tablet every evening.  2  . fluticasone (FLONASE) 50 MCG/ACT nasal spray Place 1 spray into both nostrils 2 (two) times daily. 16 g 0  . ibuprofen (ADVIL,MOTRIN) 400 MG tablet Take 1 tablet (400 mg total) by mouth every 6 (six) hours as needed. 30 tablet 0  . ondansetron (ZOFRAN ODT) 4 MG disintegrating tablet Take 1 tablet (4 mg total) by mouth every 8 (eight) hours as needed for nausea or vomiting. 20 tablet 0  . pantoprazole (PROTONIX) 20 MG tablet Take 1 tablet (20 mg total) by mouth daily. 30 tablet 1  . topiramate (TOPAMAX) 25 MG tablet Take 25 mg by mouth daily.  2  . traZODone (DESYREL)  50 MG tablet Take 50 mg by mouth at bedtime. prn     No current facility-administered medications on file prior to visit.     Objective:     Vitals:   06/25/17 1521  BP: (!) 142/95  Pulse: 97               Physical examination   Pelvic:   Vulva: Normal appearance.  No lesions.  Vagina: No lesions or abnormalities noted.  Support: Normal pelvic support.  Urethra No masses tenderness or scarring.  Meatus Normal size without lesions or prolapse.  Cervix: Normal appearance.  No lesions. IUD strings noted at cervical os.  Slightly longer than expected.  IUD not visible within the endocervix.  Anus: Normal exam.  No lesions.  Perineum: Normal exam.  No lesions.        Bimanual   Uterus: Normal size.  Non-tender.  Mobile.  AV.  Adnexae: No masses.  Non-tender to palpation.  Cul-de-sac: Negative for abnormality.     Assessment:    [id number] Patient Active Problem List   Diagnosis Date Noted  . Frequent UTI 07/22/2013  . Clinical depression 04/24/2010     1. Pelvic pain in female     Possible IUD placement.   Possible ovarian cyst  Possible uterine fibroids   Plan:            1.  Pelvic ultrasound for IUD placement and to rule out other pelvic pathology. Orders Orders Placed This Encounter  Procedures  . US PELVIS TRANSVANGINAL NON-OB (TV ONLY)    No orders of the defined types were placed in this encounter.     F/U  Return in about 2 weeks (around 07/09/2017).  Finis Bud, M.D. 06/25/2017 4:29 PM  ADDENDUM:   After the patient's ultrasound I reviewed her ultrasound and met with her again to decide management.  The ultrasound revealed the IUD to be intrauterine.  It does not appear to be too low.  Small cyst on the ovary but I doubt this is the source of her pain.  After discussing ultrasound findings and her current physical symptoms she has decided that she would like to keep the IUD for the next 2 weeks as a trial and see if her pain improves.  If her pain does not improve IUD removal.  She will keep her scheduled appointment.  I spent 28 minutes involved in the care of this patient of which greater than 50% was spent discussing her IUD, pelvic pain,  ultrasound follow-up, future management.  All of her questions were addressed.

## 2017-07-10 ENCOUNTER — Encounter: Payer: Medicaid Other | Admitting: Obstetrics and Gynecology

## 2017-07-22 ENCOUNTER — Ambulatory Visit: Payer: Medicaid Other | Admitting: Obstetrics and Gynecology

## 2017-07-22 ENCOUNTER — Encounter: Payer: Self-pay | Admitting: Obstetrics and Gynecology

## 2017-07-22 VITALS — BP 121/73 | HR 98 | Ht 65.0 in | Wt 170.9 lb

## 2017-07-22 DIAGNOSIS — Z975 Presence of (intrauterine) contraceptive device: Secondary | ICD-10-CM | POA: Diagnosis not present

## 2017-07-22 DIAGNOSIS — N921 Excessive and frequent menstruation with irregular cycle: Secondary | ICD-10-CM

## 2017-07-22 DIAGNOSIS — Z30431 Encounter for routine checking of intrauterine contraceptive device: Secondary | ICD-10-CM

## 2017-07-22 NOTE — Progress Notes (Signed)
Pt stated that since her last visit 06/25/17 she has been bleeding continually. Pt stated noticing clots of blood when she urinate. .Marland Kitchen

## 2017-07-22 NOTE — Progress Notes (Signed)
HPI:      [name] is a 42 y.o. [phone] who LMP was No LMP recorded. (Menstrual status: IUD).  Subjective:   She presents today for follow-up of her IUD.  She continues to have daily spotting but wears only a panty liner.  She has no pain.  She has not resumed intercourse yet.    Hx: The following portions of the patient's history were reviewed and updated as appropriate:             She  has a past medical history of Anxiety, Anxiety, Depression, Depression, Hemorrhoid, UTI (lower urinary tract infection), and UTI (lower urinary tract infection). She does not have any pertinent problems on file. She  has a past surgical history that includes Tubal ligation (2009) and Tubal ligation. Her family history includes Breast cancer in her other; Colon cancer in her father; Diabetes in her mother; Ovarian cancer in her other. She  reports that she has been smoking cigarettes.  She has been smoking about 0.50 packs per day. She has never used smokeless tobacco. She reports that she does not drink alcohol or use drugs. She has a current medication list which includes the following prescription(s): cetirizine, citalopram, clonazepam, ezetimibe, fluticasone, ibuprofen, ondansetron, pantoprazole, topiramate, and trazodone. She is allergic to benadryl [diphenhydramine hcl (sleep)]; diphenhydramine; sulfa antibiotics; and sulfur.       Review of Systems:  Review of Systems  Constitutional: Denied constitutional symptoms, night sweats, recent illness, fatigue, fever, insomnia and weight loss.  Eyes: Denied eye symptoms, eye pain, photophobia, vision change and visual disturbance.  Ears/Nose/Throat/Neck: Denied ear, nose, throat or neck symptoms, hearing loss, nasal discharge, sinus congestion and sore throat.  Cardiovascular: Denied cardiovascular symptoms, arrhythmia, chest pain/pressure, edema, exercise intolerance, orthopnea and palpitations.  Respiratory: Denied pulmonary symptoms, asthma,  pleuritic pain, productive sputum, cough, dyspnea and wheezing.  Gastrointestinal: Denied, gastro-esophageal reflux, melena, nausea and vomiting.  Genitourinary: Denied genitourinary symptoms including symptomatic vaginal discharge, pelvic relaxation issues, and urinary complaints.  Musculoskeletal: Denied musculoskeletal symptoms, stiffness, swelling, muscle weakness and myalgia.  Dermatologic: Denied dermatology symptoms, rash and scar.  Neurologic: Denied neurology symptoms, dizziness, headache, neck pain and syncope.  Psychiatric: Denied psychiatric symptoms, anxiety and depression.  Endocrine: Denied endocrine symptoms including hot flashes and night sweats.   Meds:   Current Outpatient Medications on File Prior to Visit  Medication Sig Dispense Refill  . cetirizine (ZYRTEC) 10 MG tablet 1 TAB(S) ORAL EVERY EVENING  5  . citalopram (CELEXA) 40 MG tablet Take 40 mg by mouth daily.    . clonazePAM (KLONOPIN) 0.5 MG tablet Take 0.5 mg by mouth at bedtime.    Marland Kitchen ezetimibe (ZETIA) 10 MG tablet every evening.  2  . fluticasone (FLONASE) 50 MCG/ACT nasal spray Place 1 spray into both nostrils 2 (two) times daily. 16 g 0  . ibuprofen (ADVIL,MOTRIN) 400 MG tablet Take 1 tablet (400 mg total) by mouth every 6 (six) hours as needed. 30 tablet 0  . ondansetron (ZOFRAN ODT) 4 MG disintegrating tablet Take 1 tablet (4 mg total) by mouth every 8 (eight) hours as needed for nausea or vomiting. 20 tablet 0  . pantoprazole (PROTONIX) 20 MG tablet Take 1 tablet (20 mg total) by mouth daily. 30 tablet 1  . topiramate (TOPAMAX) 25 MG tablet Take 25 mg by mouth daily.  2  . traZODone (DESYREL) 50 MG tablet Take 50 mg by mouth at bedtime. prn     No current facility-administered medications on  file prior to visit.     Objective:     Vitals:   07/22/17 0917  BP: 121/73  Pulse: 98              Patient declined examination today.  Assessment:    [id number] Patient Active Problem List   Diagnosis  Date Noted  . Frequent UTI 07/22/2013  . Clinical depression 04/24/2010     1. Surveillance of previously prescribed intrauterine contraceptive device   2. Breakthrough bleeding associated with intrauterine device (IUD)     Normal spotting after IUD insertion.  My expectation is that this IUD will be successful for her.  Previous ultrasound confirmed correct positioning of IUD.  Patient has no pain.   Plan:            1.  Expectant management of IUD.  I expect her bleeding to stop in the next few weeks.  We discussed cutting her IUD strings shorter today and she declined this option.  She would like to have intercourse and see if it becomes an issue.  If it is an issue she will return for shortening of IUD strings.  Orders No orders of the defined types were placed in this encounter.   No orders of the defined types were placed in this encounter.     F/U  Return for Annual Physical. I spent 17 minutes involved in the care of this patient of which greater than 50% was spent discussing IUD expectations, future bleeding, string length, smoking cessation, uterine fibroid and its effect on IUD. Elonda Huskyavid J. Madie Cahn, M.D. 07/22/2017 9:37 AM

## 2017-11-04 IMAGING — US US ABDOMEN LIMITED
1 series · 14 of 25 positions shown · non-contrast
Comparison: 12/15/2015

CLINICAL DATA: Epigastric pain and nausea for 2 weeks.

EXAM:
ULTRASOUND ABDOMEN LIMITED RIGHT UPPER QUADRANT

[Series 1: us abdomen limited · 0.20mm/px · 14 of 48 slices shown]
[im 1/48]
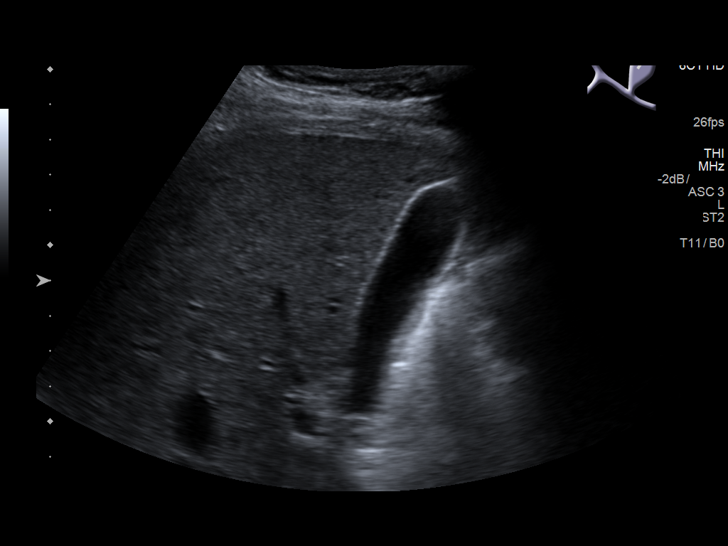
[im 4/48]
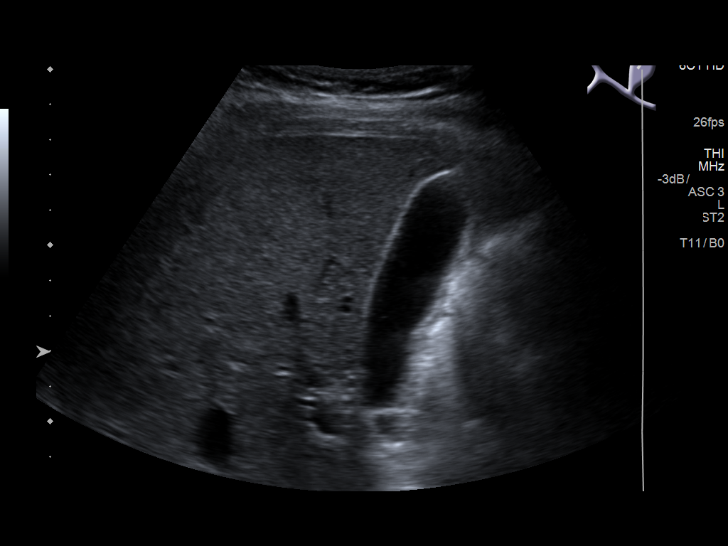
[im 8/48]
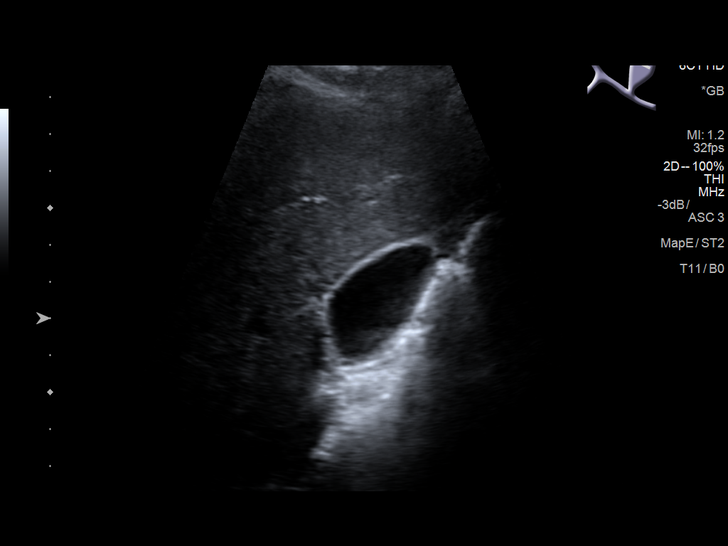
[im 12/48]
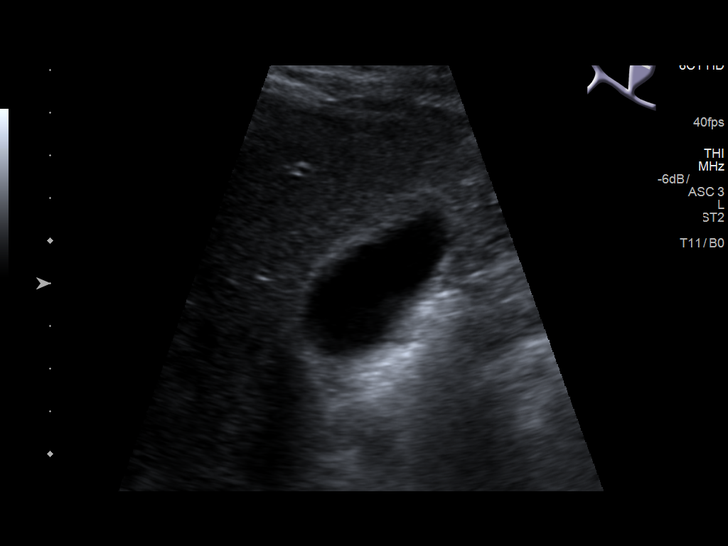
[im 16/48]
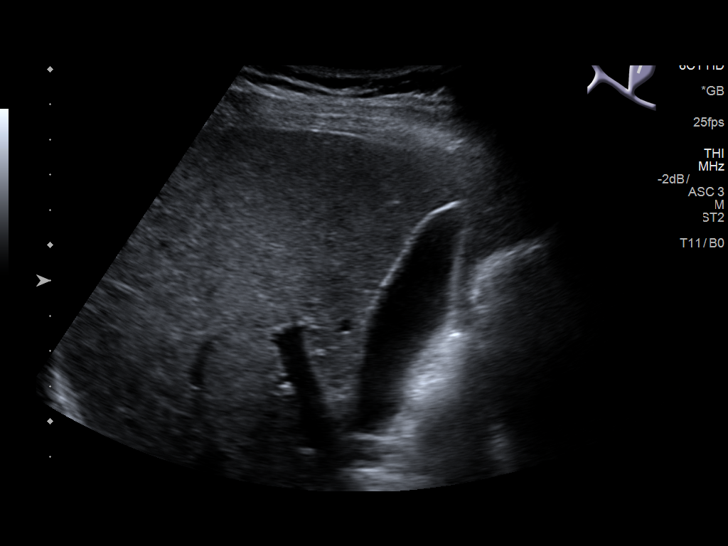
[im 18/48]
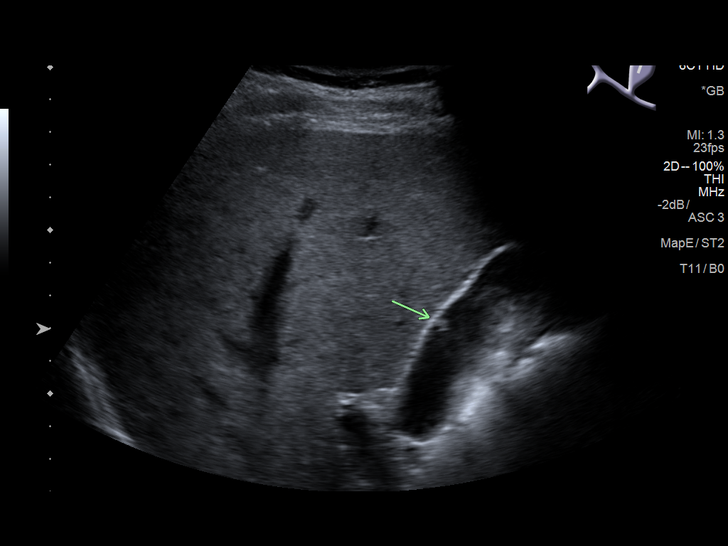
[im 22/48]
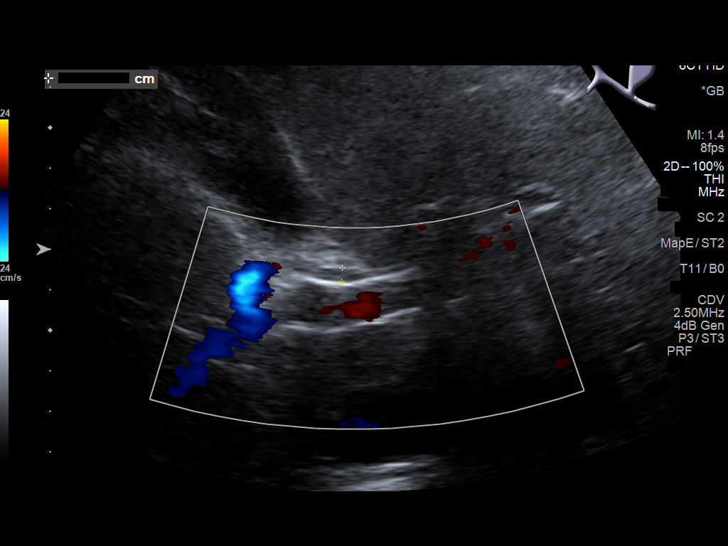
[im 26/48]
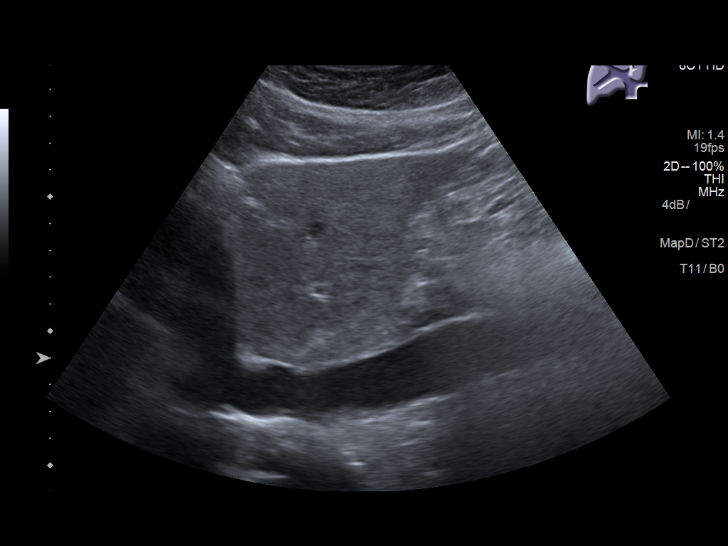
[im 30/48]
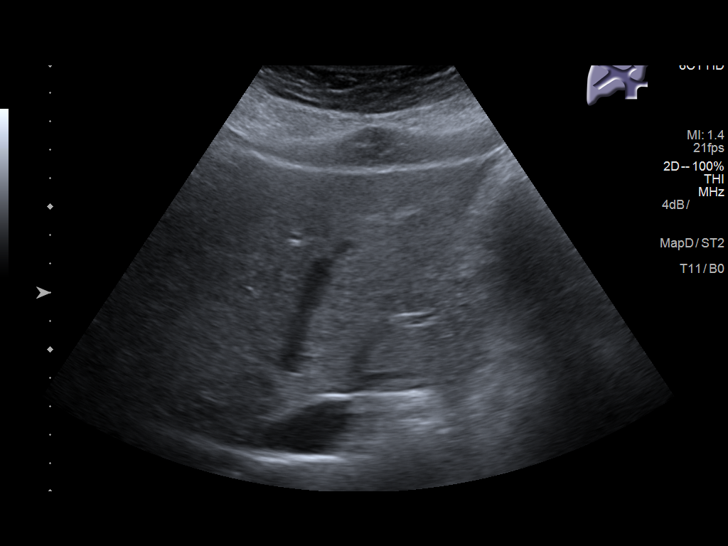
[im 32/48]
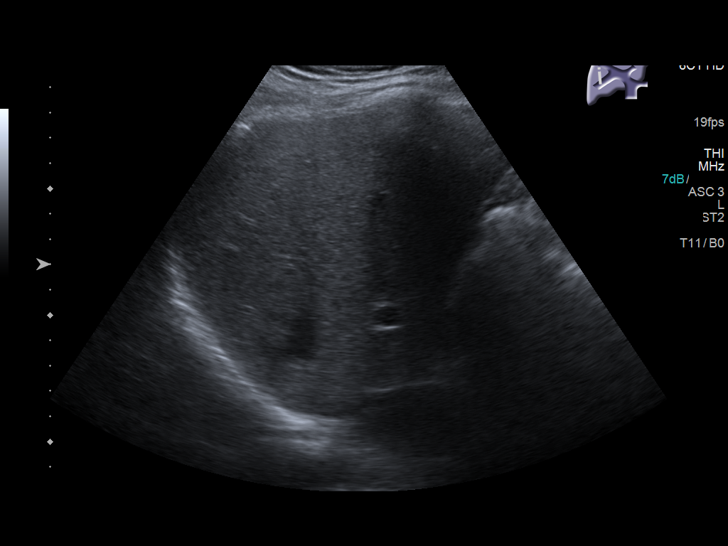
[im 36/48]
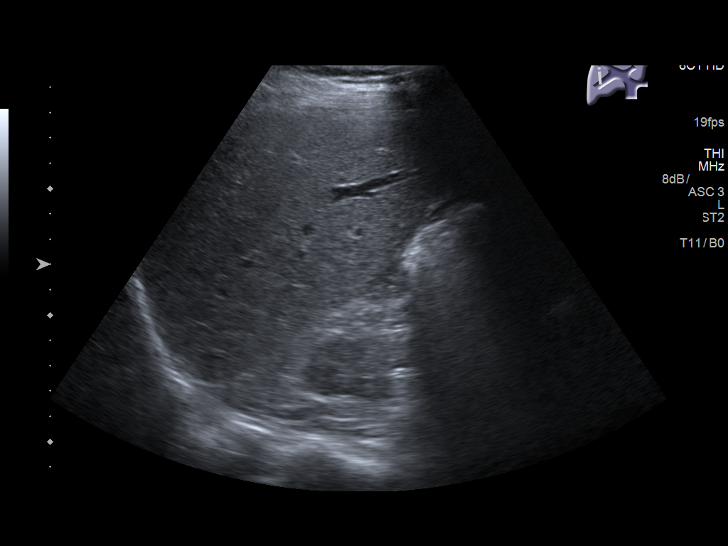
[im 40/48]
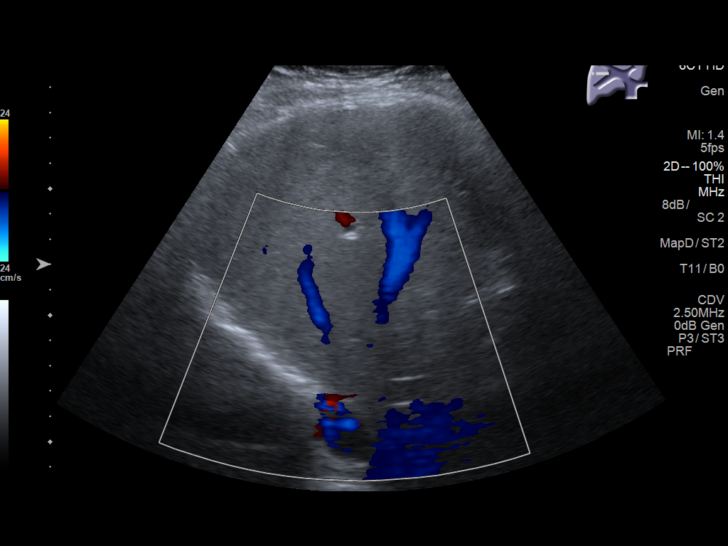
[im 44/48]
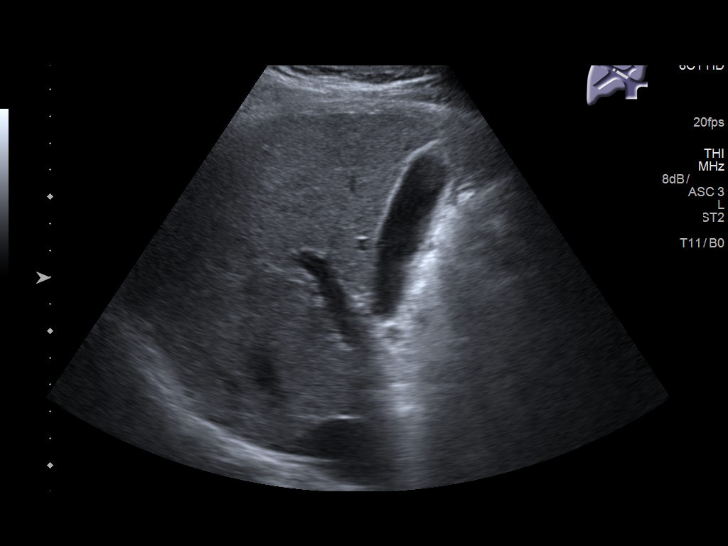
[im 48/48]
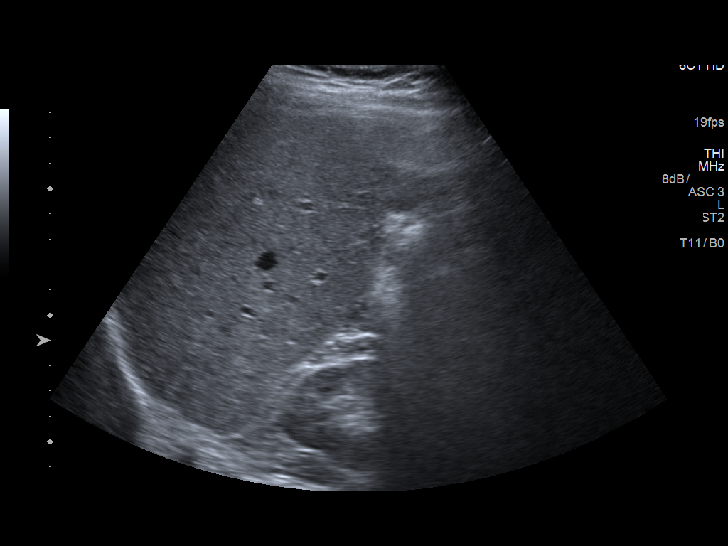

[14 of 25 positions shown; findings below may reference images not displayed]

FINDINGS: Gallbladder:

Small echogenic focus in the gallbladder measuring up to 0.4 cm.
This appears to be non-mobile and probably represents a small polyp.
No gallbladder wall thickening. No sonographic Murphy sign. No
pericholecystic fluid. Gallbladder is not distended.

Common bile duct:

Diameter: 0.4 cm.

Liver:

No focal lesion identified. Within normal limits in parenchymal
echogenicity. Main portal vein is patent.
IMPRESSION: Probable small gallbladder polyp. No inflammatory changes involving
the gallbladder.

Normal appearance of the liver without biliary dilatation.

## 2018-07-02 ENCOUNTER — Other Ambulatory Visit: Payer: Self-pay | Admitting: Family Medicine

## 2018-07-02 DIAGNOSIS — Z1231 Encounter for screening mammogram for malignant neoplasm of breast: Secondary | ICD-10-CM

## 2018-09-02 ENCOUNTER — Inpatient Hospital Stay: Admission: RE | Admit: 2018-09-02 | Payer: Medicaid Other | Source: Ambulatory Visit

## 2018-10-12 ENCOUNTER — Other Ambulatory Visit: Payer: Self-pay | Admitting: Family Medicine

## 2018-10-12 DIAGNOSIS — Z1231 Encounter for screening mammogram for malignant neoplasm of breast: Secondary | ICD-10-CM

## 2018-11-19 ENCOUNTER — Ambulatory Visit
Admission: RE | Admit: 2018-11-19 | Discharge: 2018-11-19 | Disposition: A | Discharge status: Home / Self Care | Payer: Medicaid Other | Source: Ambulatory Visit | Attending: Family Medicine | Admitting: Family Medicine

## 2018-11-19 DIAGNOSIS — Z1231 Encounter for screening mammogram for malignant neoplasm of breast: Secondary | ICD-10-CM | POA: Diagnosis not present

## 2019-06-24 ENCOUNTER — Ambulatory Visit: Payer: Medicaid Other | Admitting: Podiatry

## 2019-08-13 ENCOUNTER — Other Ambulatory Visit: Payer: Self-pay | Admitting: Otolaryngology

## 2019-08-13 DIAGNOSIS — E041 Nontoxic single thyroid nodule: Secondary | ICD-10-CM

## 2019-11-04 ENCOUNTER — Other Ambulatory Visit: Admission: RE | Admit: 2019-11-04 | Payer: Medicaid Other | Source: Ambulatory Visit

## 2019-11-08 ENCOUNTER — Encounter: Admission: RE | Payer: Self-pay | Source: Home / Self Care

## 2019-11-08 ENCOUNTER — Ambulatory Visit: Admission: RE | Admit: 2019-11-08 | Payer: Medicaid Other | Source: Home / Self Care

## 2019-11-08 SURGERY — COLONOSCOPY
Anesthesia: General

## 2019-11-16 IMAGING — MG MM DIGITAL SCREENING BILAT W/ CAD
4 series · 4 of 4 positions shown · non-contrast
Comparison: None.

CLINICAL DATA: Screening.

EXAM:
DIGITAL SCREENING BILATERAL MAMMOGRAM WITH CAD

[R CC]
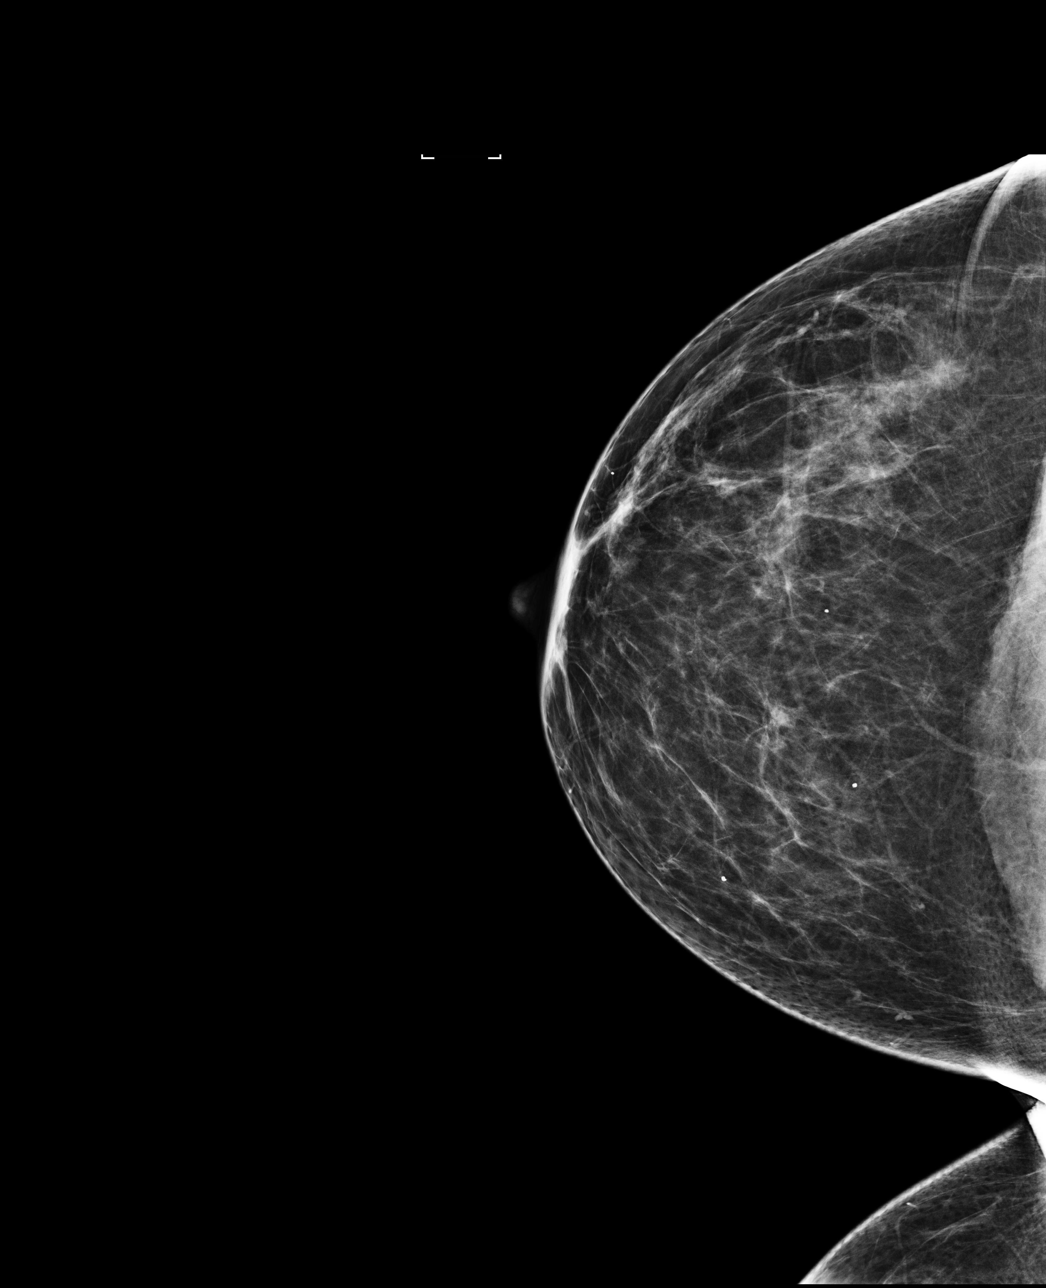

[L CC]
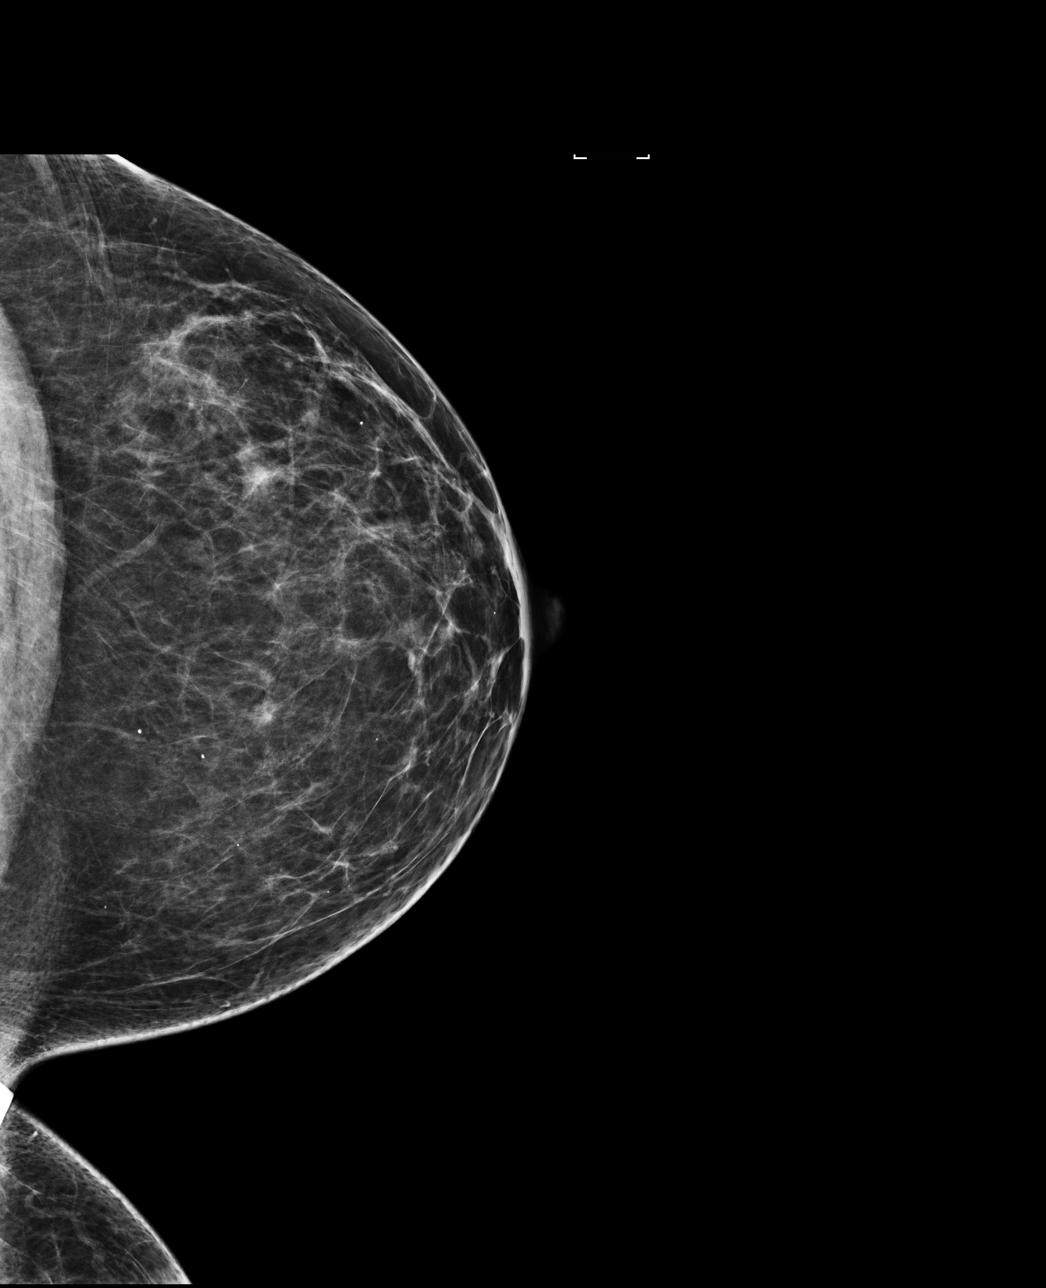

[R MLO]
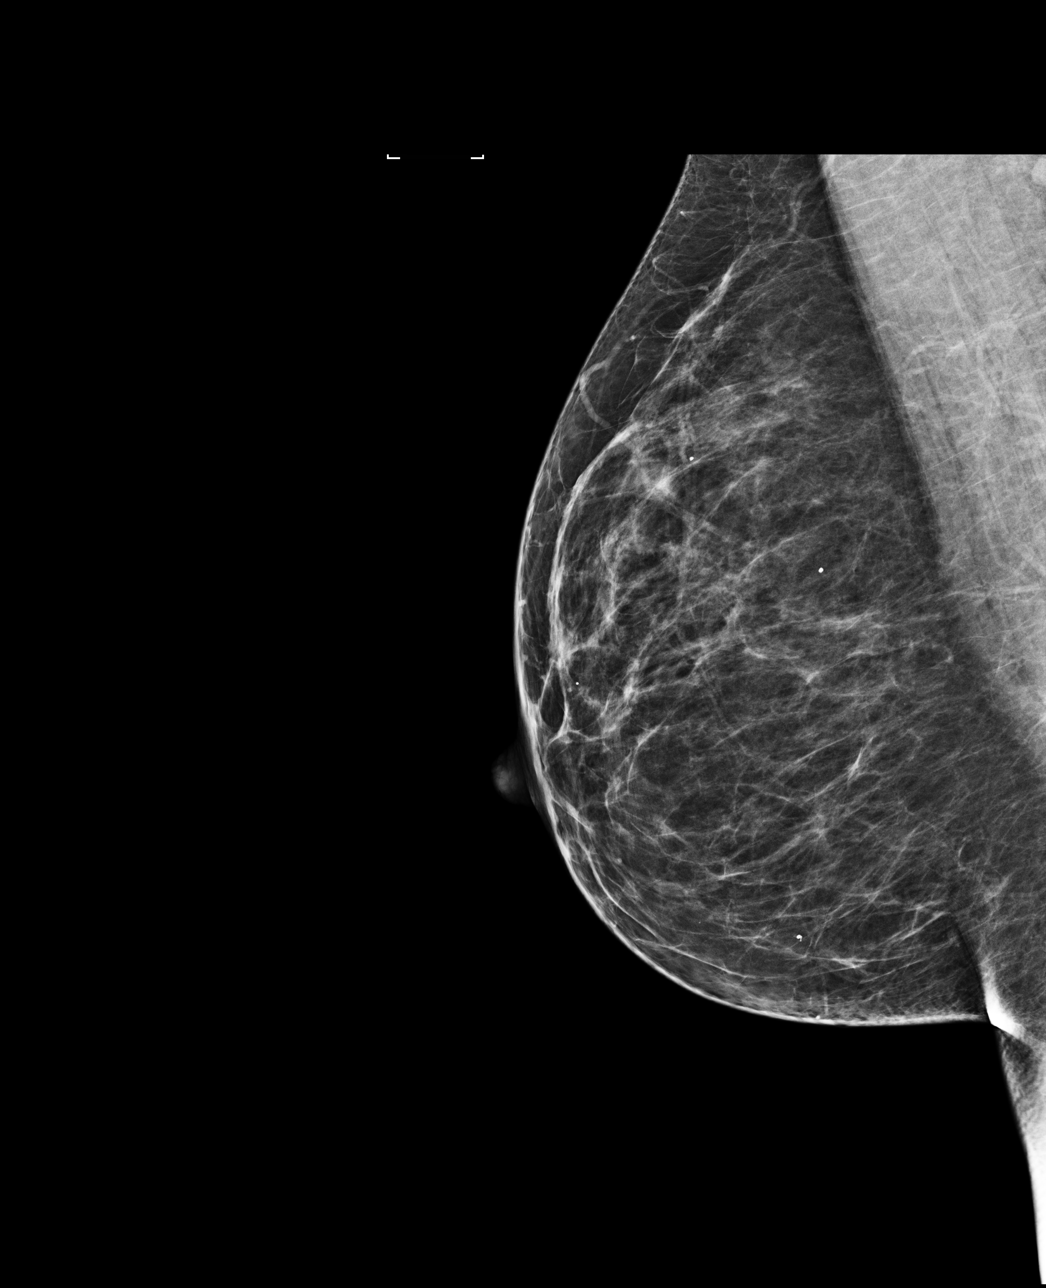

[L MLO]
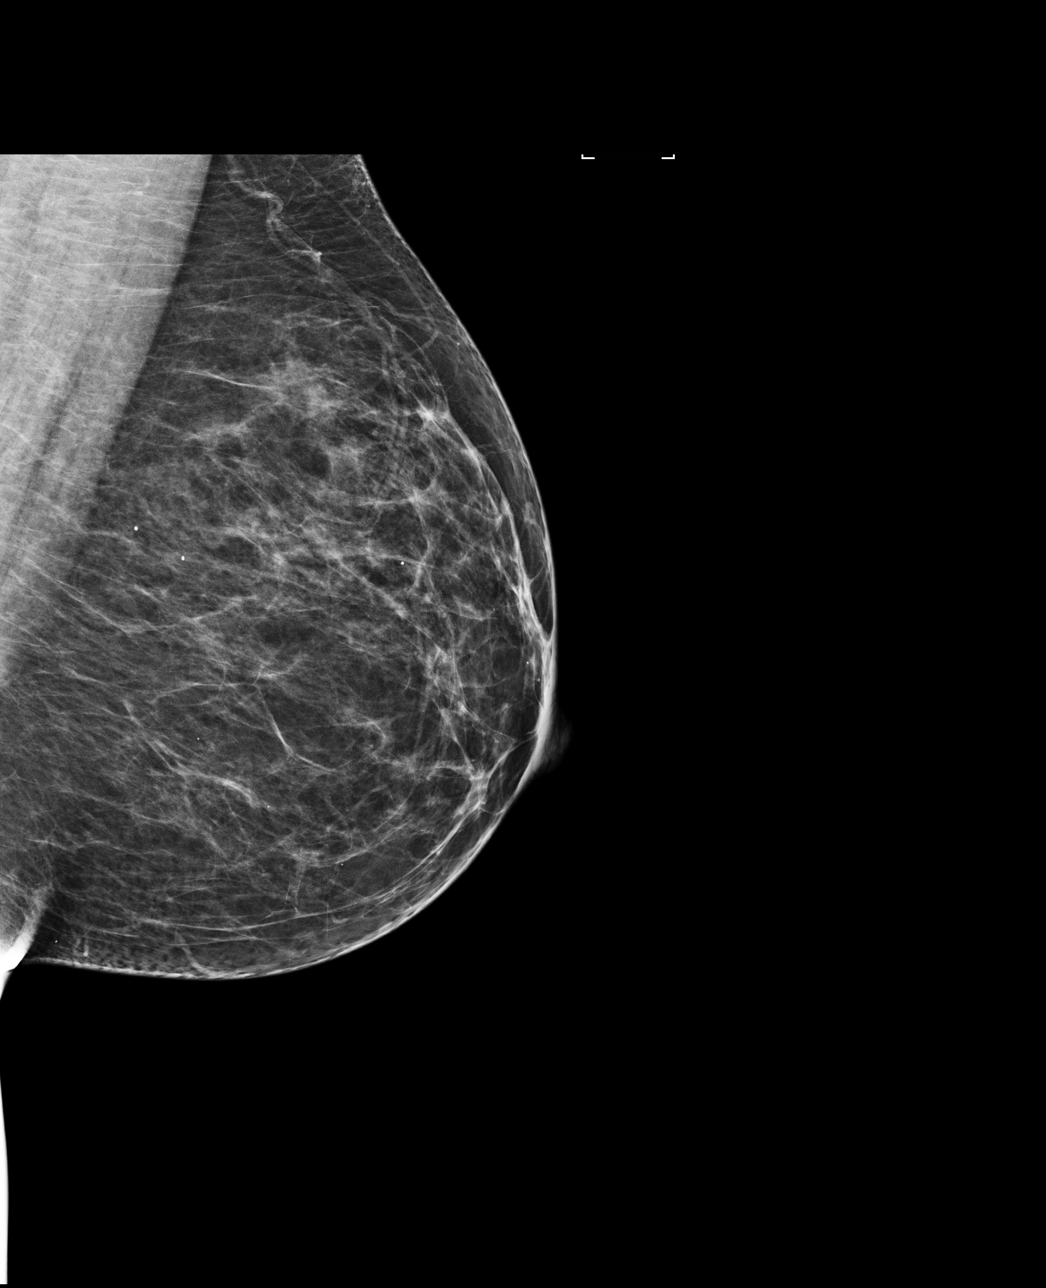

[4 of 4 positions shown; findings below may reference images not displayed]

ACR Breast Density Category b: There are scattered areas of
fibroglandular density.
FINDINGS: There are no findings suspicious for malignancy. Images were
processed with CAD.
IMPRESSION: No mammographic evidence of malignancy. A result letter of this
screening mammogram will be mailed directly to the patient.

RECOMMENDATION:
Screening mammogram in one year. (Code:[id number])

BI-RADS CATEGORY  1: Negative.

## 2019-12-27 ENCOUNTER — Ambulatory Visit: Payer: Medicaid Other | Attending: Otolaryngology

## 2020-01-20 ENCOUNTER — Ambulatory Visit: Payer: Medicaid Other | Attending: Otolaryngology

## 2020-04-27 ENCOUNTER — Other Ambulatory Visit: Payer: Self-pay

## 2020-04-27 ENCOUNTER — Emergency Department: Payer: Medicaid Other

## 2020-04-27 ENCOUNTER — Emergency Department
Admission: EM | Admit: 2020-04-27 | Discharge: 2020-04-27 | Disposition: A | Discharge status: Home / Self Care | Payer: Medicaid Other | Attending: Emergency Medicine | Admitting: Emergency Medicine

## 2020-04-27 DIAGNOSIS — M7989 Other specified soft tissue disorders: Secondary | ICD-10-CM | POA: Insufficient documentation

## 2020-04-27 DIAGNOSIS — X501XXA Overexertion from prolonged static or awkward postures, initial encounter: Secondary | ICD-10-CM | POA: Diagnosis not present

## 2020-04-27 DIAGNOSIS — Y9301 Activity, walking, marching and hiking: Secondary | ICD-10-CM | POA: Insufficient documentation

## 2020-04-27 DIAGNOSIS — S8991XA Unspecified injury of right lower leg, initial encounter: Secondary | ICD-10-CM | POA: Diagnosis present

## 2020-04-27 DIAGNOSIS — M79661 Pain in right lower leg: Secondary | ICD-10-CM

## 2020-04-27 DIAGNOSIS — F17210 Nicotine dependence, cigarettes, uncomplicated: Secondary | ICD-10-CM | POA: Insufficient documentation

## 2020-04-27 DIAGNOSIS — T148XXA Other injury of unspecified body region, initial encounter: Secondary | ICD-10-CM

## 2020-04-27 DIAGNOSIS — S86911A Strain of unspecified muscle(s) and tendon(s) at lower leg level, right leg, initial encounter: Secondary | ICD-10-CM | POA: Insufficient documentation

## 2020-04-27 MED ORDER — HYDROCODONE-ACETAMINOPHEN 5-325 MG PO TABS: 1.0000 | ORAL_TABLET | Freq: Four times a day (QID) | ORAL | 0 refills | Status: DC | PRN

## 2020-04-27 MED ORDER — HYDROCODONE-ACETAMINOPHEN 5-325 MG PO TABS
1.0000 | ORAL_TABLET | Freq: Once | ORAL | Status: AC
  Administered 2020-04-27: 1 via ORAL
  Filled 2020-04-27: qty 1

## 2020-04-27 NOTE — ED Provider Notes (Signed)
[facility] Emergency Department Provider Note   ____________________________________________   Event Date/Time   First MD Initiated Contact with Patient 04/27/20 [phone]     (approximate)  I have reviewed the triage vital signs and the nursing notes.   HISTORY  Chief Complaint Leg Pain   HPI Debra Wang is a 45 y.o. female resents to the ED with complaint of right calf pain.  Patient states that she was walking down the steps outside last night when she missed 1 step.  She states that she felt a "pop" in her calf area and has had pain with swelling and tenderness since that time.  Patient reports that she has not taken any over-the-counter medication this morning.  She continues to ambulate but has difficulty bearing weight.  She denies any previous injury to her right leg.  She rates her pain as a 9 out of 10.     Past Medical History:  Diagnosis Date  . Anxiety   . Anxiety   . Depression   . Depression   . Hemorrhoid   . UTI (lower urinary tract infection)   . UTI (lower urinary tract infection)     Patient Active Problem List   Diagnosis Date Noted  . Frequent UTI 07/22/2013  . Clinical depression 04/24/2010    Past Surgical History:  Procedure Laterality Date  . TUBAL LIGATION  2009  . TUBAL LIGATION      Prior to Admission medications   Medication Sig Start Date End Date Taking? Authorizing Provider  HYDROcodone-acetaminophen (NORCO/VICODIN) 5-325 MG tablet Take 1 tablet by mouth every 6 (six) hours as needed. 04/27/20  Yes Bridget Hartshorn L, PA-C  cetirizine (ZYRTEC) 10 MG tablet 1 TAB(S) ORAL EVERY EVENING 06/19/17   [provider]  citalopram (CELEXA) 40 MG tablet Take 40 mg by mouth daily.    [provider]  clonazePAM (KLONOPIN) 0.5 MG tablet Take 0.5 mg by mouth at bedtime.    [provider]  ezetimibe (ZETIA) 10 MG tablet every evening. 06/18/17   [provider]  fluticasone (FLONASE) 50  MCG/ACT nasal spray Place 1 spray into both nostrils 2 (two) times daily. 11/10/16   Cuthriell, Delorise Royals, PA-C  ibuprofen (ADVIL,MOTRIN) 400 MG tablet Take 1 tablet (400 mg total) by mouth every 6 (six) hours as needed. 12/15/15   Jennye Moccasin, MD  ondansetron (ZOFRAN ODT) 4 MG disintegrating tablet Take 1 tablet (4 mg total) by mouth every 8 (eight) hours as needed for nausea or vomiting. 02/22/17   Minna Antis, MD  pantoprazole (PROTONIX) 20 MG tablet Take 1 tablet (20 mg total) by mouth daily. 08/27/16 08/27/17  Jene Every, MD  topiramate (TOPAMAX) 25 MG tablet Take 25 mg by mouth daily. 06/16/17   [provider]  traZODone (DESYREL) 50 MG tablet Take 50 mg by mouth at bedtime. prn    [provider]    Allergies Benadryl [diphenhydramine hcl (sleep)], Diphenhydramine, Elemental sulfur, and Sulfa antibiotics  Family History  Problem Relation Age of Onset  . Diabetes Mother   . Colon cancer Father   . Breast cancer Other   . Ovarian cancer Other     Social History Social History   Tobacco Use  . Smoking status: Current Every Day Smoker    Packs/day: 0.50    Types: Cigarettes  . Smokeless tobacco: Never Used  Vaping Use  . Vaping Use: Never used  Substance Use Topics  . Alcohol use: No  .  Drug use: No    Review of Systems Constitutional: No fever/chills Eyes: No visual changes. ENT: No trauma. Cardiovascular: Denies chest pain. Respiratory: Denies shortness of breath. Genitourinary: Negative for dysuria. Musculoskeletal: Positive for right leg pain. Skin: Negative for rash. Neurological: Negative for headaches, focal weakness or numbness. ____________________________________________   PHYSICAL EXAM:  VITAL SIGNS: ED Triage Vitals  Enc Vitals Group     BP 04/27/20 0921 (!) 147/100     Pulse Rate 04/27/20 0921 73     Resp 04/27/20 0921 15     Temp 04/27/20 0921 98.4 F (36.9 C)     Temp Source 04/27/20 0921 Oral     SpO2 04/27/20  0921 97 %     Weight 04/27/20 0917 160 lb (72.6 kg)     Height 04/27/20 0917 5\' 5"  (1.651 m)     Head Circumference --      Peak Flow --      Pain Score 04/27/20 0917 9     Pain Loc --      Pain Edu? --      Excl. in GC? --     Constitutional: Alert and oriented. Well appearing and in no acute distress. Eyes: Conjunctivae are normal. PERRL. EOMI. Head: Atraumatic. Neck: No stridor.   Cardiovascular: Normal rate, regular rhythm. Grossly normal heart sounds.  Good peripheral circulation. Respiratory: Normal respiratory effort.  No retractions. Lungs CTAB. Musculoskeletal: On examination of the right leg there is no gross deformity.  There is moderate to marked tenderness noted on palpation of the calf area.  No abrasions or discoloration is noted of the skin.  Pulses are present distally.  Motor sensory function intact.  No injury or effusion noted on examination of the patella area.  Thompson sign was negative. Neurologic:  Normal speech and language. No gross focal neurologic deficits are appreciated.  Skin:  Skin is warm, dry and intact. No rash noted. Psychiatric: Mood and affect are normal. Speech and behavior are normal.  ____________________________________________   LABS (all labs ordered are listed, but only abnormal results are displayed)  Labs Reviewed - No data to display ____________________________________________   RADIOLOGY I, 06/27/20, personally viewed and evaluated these images (plain radiographs) as part of my medical decision making, as well as reviewing the written report by the radiologist.  Official radiology report(s): Tommi Rumps RT LOWER EXTREM LTD SOFT TISSUE NON VASCULAR  Result Date: 04/27/2020 CLINICAL DATA:  Right calf pain. Missed a step and felt a pop in her calf. EXAM: ULTRASOUND right LOWER EXTREMITY LIMITED TECHNIQUE: Ultrasound examination of the lower extremity soft tissues was performed in the area of clinical concern. COMPARISON:  None.  FINDINGS: No fluid collection, mass or hematoma is identified. If symptoms persist MRI may be helpful for further evaluation if clinically necessary. IMPRESSION: Negative ultrasound examination. Electronically Signed   By: 06/27/2020 M.D.   On: 04/27/2020 11:11    ____________________________________________   PROCEDURES  Procedure(s) performed (including Critical Care):  Procedures   ____________________________________________   INITIAL IMPRESSION / ASSESSMENT AND PLAN / ED COURSE  As part of my medical decision making, I reviewed the following data within the electronic MEDICAL RECORD NUMBER Notes from prior ED visits and [place] Controlled Substance Database  45 year old female presents to the ED with complaint of right calf pain after she missed a step last evening and felt a "pop" in her calf area.  She continues to have some discomfort and tender to touch this morning.  Denies any  previous injury to her leg.  Thompson sign was negative for tendon rupture and was reassuring.  Ultrasound was negative for fluid or soft tissue injury.  Patient was made aware.  An Ace wrap was applied to her leg and crutches for support and protection.  Patient was given a prescription for hydrocodone-acetaminophen to take as needed every 6 hours for pain.  She is to follow-up with her PCP if any continued problems or Dr. Rosita Kea if not improving at all for continued evaluation of her right leg pain.  ____________________________________________   FINAL CLINICAL IMPRESSION(S) / ED DIAGNOSES  Final diagnoses:  Right calf pain  Muscle strain     ED Discharge Orders         Ordered    HYDROcodone-acetaminophen (NORCO/VICODIN) 5-325 MG tablet  Every 6 hours PRN        04/27/20 1137          *Please note:  Debra Wang was evaluated in Emergency Department on 04/27/2020 for the symptoms described in the history of present illness. She was evaluated in the context of the global COVID-19 pandemic, which  necessitated consideration that the patient might be at risk for infection with the SARS-CoV-2 virus that causes COVID-19. Institutional protocols and algorithms that pertain to the evaluation of patients at risk for COVID-19 are in a state of rapid change based on information released by regulatory bodies including the CDC and federal and state organizations. These policies and algorithms were followed during the patient's care in the ED.  Some ED evaluations and interventions may be delayed as a result of limited staffing during and the pandemic.*   Note:  This document was prepared using Dragon voice recognition software and may include unintentional dictation errors.    Tommi Rumps, PA-C 04/27/20 1209    Merwyn Katos, MD 04/27/20 1450

## 2020-04-27 NOTE — Discharge Instructions (Signed)
Follow-up with your primary care provider or Dr. Rosita Kea who is on-call for orthopedics if not improving or any worsening of your symptoms.  His office is in the orthopedic department at [facility].  You may ice and elevate your leg as needed for pain, swelling as this may help with symptoms.  Ace wrap is for compression and use crutches for walking and for added support.  You may take ibuprofen as needed for inflammation and pain.  A prescription for hydrocodone was sent to your pharmacy to take as needed for moderate to severe pain.

## 2020-04-27 NOTE — ED Notes (Signed)
Pt states she was walking down steps and miss one and came down on the right leg and felt something pop in the calf area and states it has been swollen and is tender to touch.

## 2020-04-27 NOTE — ED Notes (Signed)
Patient educated on ace wrap and crutches.

## 2020-04-27 NOTE — ED Triage Notes (Signed)
Pt reports that she missed a step last night and felt a pop to the back of her rt calf. Pt reports difficulty bearing weight.

## 2020-05-02 ENCOUNTER — Other Ambulatory Visit: Payer: Self-pay | Admitting: Family Medicine

## 2020-05-02 DIAGNOSIS — Z1231 Encounter for screening mammogram for malignant neoplasm of breast: Secondary | ICD-10-CM

## 2021-01-29 ENCOUNTER — Emergency Department: Payer: Medicaid Other

## 2021-01-29 ENCOUNTER — Other Ambulatory Visit: Payer: Self-pay

## 2021-01-29 ENCOUNTER — Emergency Department
Admission: EM | Admit: 2021-01-29 | Discharge: 2021-01-29 | Disposition: A | Discharge status: Home / Self Care | Payer: Medicaid Other | Attending: Emergency Medicine | Admitting: Emergency Medicine

## 2021-01-29 DIAGNOSIS — F17210 Nicotine dependence, cigarettes, uncomplicated: Secondary | ICD-10-CM | POA: Insufficient documentation

## 2021-01-29 DIAGNOSIS — J209 Acute bronchitis, unspecified: Secondary | ICD-10-CM | POA: Diagnosis not present

## 2021-01-29 DIAGNOSIS — R062 Wheezing: Secondary | ICD-10-CM

## 2021-01-29 DIAGNOSIS — R059 Cough, unspecified: Secondary | ICD-10-CM | POA: Diagnosis present

## 2021-01-29 DIAGNOSIS — F17200 Nicotine dependence, unspecified, uncomplicated: Secondary | ICD-10-CM

## 2021-01-29 MED ORDER — PREDNISONE 20 MG PO TABS
60.0000 mg | ORAL_TABLET | Freq: Once | ORAL | Status: AC
  Administered 2021-01-29: 60 mg via ORAL
  Filled 2021-01-29: qty 3

## 2021-01-29 MED ORDER — PREDNISONE 10 MG PO TABS: ORAL_TABLET | ORAL | 0 refills | Status: DC

## 2021-01-29 MED ORDER — ALBUTEROL SULFATE HFA 108 (90 BASE) MCG/ACT IN AERS: 2.0000 | INHALATION_SPRAY | Freq: Four times a day (QID) | RESPIRATORY_TRACT | 2 refills | Status: DC | PRN

## 2021-01-29 MED ORDER — IPRATROPIUM-ALBUTEROL 0.5-2.5 (3) MG/3ML IN SOLN
3.0000 mL | Freq: Once | RESPIRATORY_TRACT | Status: AC
  Administered 2021-01-29: 3 mL via RESPIRATORY_TRACT
  Filled 2021-01-29: qty 3

## 2021-01-29 NOTE — ED Provider Notes (Signed)
[facility] Provider Note    Event Date/Time   First MD Initiated Contact with Patient 01/29/21 [phone]     (approximate)   History   Cough   HPI  Debra Wang is a 46 y.o. female   presents to the ED with complaint of nonproductive cough for 2 months.  Patient states that she has been to her PCP and also to an urgent care and treated for bronchitis without any relief of her symptoms.  She states she was placed on an antibiotic but does not remember which 1.  She denies any symptoms of COVID currently but has had COVID in the past.  Patient has a history of smoking for approximately 10 years 1/2 pack cigarettes per day.  Patient has a history of anxiety and depression.  She reports some rib pain with coughing and length.  She rates her pain as 5 out of 10.      Physical Exam   Triage Vital Signs: ED Triage Vitals  Enc Vitals Group     BP 01/29/21 0834 (!) 127/94     Pulse Rate 01/29/21 0834 83     Resp 01/29/21 0834 16     Temp 01/29/21 0834 98.1 F (36.7 C)     Temp Source 01/29/21 0834 Oral     SpO2 01/29/21 0834 96 %     Weight --      Height --      Head Circumference --      Peak Flow --      Pain Score 01/29/21 0833 5     Pain Loc --      Pain Edu? --      Excl. in GC? --     Most recent vital signs: Vitals:   01/29/21 0834  BP: (!) 127/94  Pulse: 83  Resp: 16  Temp: 98.1 F (36.7 C)  SpO2: 96%     General: Awake, no distress.  Able to talk in complete sentences without any difficulty. CV:  Good peripheral perfusion.  Heart regular rate and rhythm without murmur. Resp:  Normal effort.  Bilateral expiratory wheeze heard throughout.  No rales or rhonchi noted. Abd:  No distention.  Other:     ED Results / Procedures / Treatments   Labs (all labs ordered are listed, but only abnormal results are displayed) Labs Reviewed - No data to display   EKG  None   RADIOLOGY  Radiology report and chest x-ray reviewed by  myself and was negative for acute cardiopulmonary disease.   PROCEDURES:  Critical Care performed: No  Procedures   MEDICATIONS ORDERED IN ED: Medications  predniSONE (DELTASONE) tablet 60 mg (60 mg Oral Given 01/29/21 1029)  ipratropium-albuterol (DUONEB) 0.5-2.5 (3) MG/3ML nebulizer solution 3 mL (3 mLs Nebulization Given 01/29/21 1029)     IMPRESSION / MDM / ASSESSMENT AND PLAN / ED COURSE  I reviewed the triage vital signs and the nursing notes.                              Differential diagnosis includes, but is not limited to, asthma, bronchitis, pneumonia, COPD with exacerbation.   ----------------------------------------- 10:55AM on 01/29/2021 ----------------------------------------- Patient was given prednisone 60 mg p.o. and a DuoNeb treatment while in the emergency department.  She cleared and was breathing much better.  Minimal wheezing was noted and patient was able to speak in complete sentences without any difficulty.  Chest x-ray and radiology report was reviewed by myself and no acute changes were noted.  Patient was encouraged to discontinue smoking.  A prescription for a prednisone taper was sent to her pharmacy along with an albuterol inhaler.  Patient is encouraged to follow-up with her primary care if any continued problems and return to the emergency department if any severe worsening or difficulty breathing.   FINAL CLINICAL IMPRESSION(S) / ED DIAGNOSES   Final diagnoses:  Acute bronchitis, unspecified organism  Wheezing  Smoker     Rx / DC Orders   ED Discharge Orders          Ordered    albuterol (VENTOLIN HFA) 108 (90 Base) MCG/ACT inhaler  Every 6 hours PRN        01/29/21 1102    predniSONE (DELTASONE) 10 MG tablet        01/29/21 1102             Note:  This document was prepared using Dragon voice recognition software and may include unintentional dictation errors.   Johnn Hai, PA-C 01/29/21 1309    Blake Divine,  MD 01/29/21 [phone]

## 2021-01-29 NOTE — ED Triage Notes (Signed)
Pt comes with c/o cough that has been persistent for about a month. Pt states she has been evaluated by PCP. Pt states pain in left side rib from all the coughing. Pt denies any fever or chills.  Pt states some post nasal drip to throat.

## 2021-01-29 NOTE — Discharge Instructions (Signed)
Call make a follow-up appointment with your primary care provider if any continued problems.  An albuterol inhaler was sent to the pharmacy to use every 6 hours as needed for wheezing or shortness of breath.  The prednisone the first day was given to you while in the emergency department however you will need to take it for another 5 days and the directions on the label will taper you down over the next 5 days.  Increase fluids.  Decrease smoking.  Return to the emergency department if any severe worsening of your symptoms such as difficulty breathing or shortness of breath.

## 2021-03-17 ENCOUNTER — Emergency Department
Admission: EM | Admit: 2021-03-17 | Discharge: 2021-03-17 | Disposition: A | Discharge status: Home / Self Care | Payer: Medicaid Other | Attending: Emergency Medicine | Admitting: Emergency Medicine

## 2021-03-17 ENCOUNTER — Other Ambulatory Visit: Payer: Self-pay

## 2021-03-17 DIAGNOSIS — K0889 Other specified disorders of teeth and supporting structures: Secondary | ICD-10-CM | POA: Diagnosis present

## 2021-03-17 DIAGNOSIS — K047 Periapical abscess without sinus: Secondary | ICD-10-CM | POA: Insufficient documentation

## 2021-03-17 LAB — CBC WITH DIFFERENTIAL/PLATELET
Abs Immature Granulocytes: 0.05 10*3/uL (ref 0.00–0.07)
Basophils Absolute: 0.1 10*3/uL (ref 0.0–0.1)
Basophils Relative: 1 %
Eosinophils Absolute: 0.2 10*3/uL (ref 0.0–0.5)
Eosinophils Relative: 2 %
HCT: 42.5 % (ref 36.0–46.0)
Hemoglobin: 14.6 g/dL (ref 12.0–15.0)
Immature Granulocytes: 0 %
Lymphocytes Relative: 14 %
Lymphs Abs: 1.5 10*3/uL (ref 0.7–4.0)
MCH: 30.8 pg (ref 26.0–34.0)
MCHC: 34.4 g/dL (ref 30.0–36.0)
MCV: 89.7 fL (ref 80.0–100.0)
Monocytes Absolute: 1.1 10*3/uL — ABNORMAL HIGH (ref 0.1–1.0)
Monocytes Relative: 9 %
Neutro Abs: 8.4 10*3/uL — ABNORMAL HIGH (ref 1.7–7.7)
Neutrophils Relative %: 74 %
Platelets: 187 10*3/uL (ref 150–400)
RBC: 4.74 MIL/uL (ref 3.87–5.11)
RDW: 12.5 % (ref 11.5–15.5)
WBC: 11.3 10*3/uL — ABNORMAL HIGH (ref 4.0–10.5)
nRBC: 0 % (ref 0.0–0.2)

## 2021-03-17 LAB — BASIC METABOLIC PANEL
Anion gap: 6 (ref 5–15)
BUN: 11 mg/dL (ref 6–20)
CO2: 26 mmol/L (ref 22–32)
Calcium: 8.9 mg/dL (ref 8.9–10.3)
Chloride: 105 mmol/L (ref 98–111)
Creatinine, Ser: 0.65 mg/dL (ref 0.44–1.00)
GFR, Estimated: >60 mL/min (ref >60)
Glucose, Bld: 107 mg/dL — ABNORMAL HIGH (ref 70–99)
Potassium: 3.6 mmol/L (ref 3.5–5.1)
Sodium: 137 mmol/L (ref 135–145)

## 2021-03-17 MED ORDER — ONDANSETRON HCL 4 MG/2ML IJ SOLN
4.0000 mg | Freq: Once | INTRAMUSCULAR | Status: AC
  Administered 2021-03-17: 4 mg via INTRAVENOUS
  Filled 2021-03-17: qty 2

## 2021-03-17 MED ORDER — CLINDAMYCIN HCL 150 MG PO CAPS: 150.0000 mg | ORAL_CAPSULE | Freq: Four times a day (QID) | ORAL | 0 refills | Status: AC

## 2021-03-17 MED ORDER — KETOROLAC TROMETHAMINE 30 MG/ML IJ SOLN
30.0000 mg | Freq: Once | INTRAMUSCULAR | Status: AC
  Administered 2021-03-17: 30 mg via INTRAVENOUS
  Filled 2021-03-17: qty 1

## 2021-03-17 MED ORDER — OXYCODONE-ACETAMINOPHEN 7.5-325 MG PO TABS: 1.0000 | ORAL_TABLET | Freq: Four times a day (QID) | ORAL | 0 refills | Status: DC | PRN

## 2021-03-17 MED ORDER — CLINDAMYCIN PHOSPHATE 600 MG/50ML IV SOLN
600.0000 mg | Freq: Once | INTRAVENOUS | Status: AC
  Administered 2021-03-17: 600 mg via INTRAVENOUS
  Filled 2021-03-17: qty 50

## 2021-03-17 MED ORDER — HYDROMORPHONE HCL 1 MG/ML IJ SOLN
1.0000 mg | Freq: Once | INTRAMUSCULAR | Status: AC
  Administered 2021-03-17: 1 mg via INTRAVENOUS
  Filled 2021-03-17: qty 1

## 2021-03-17 NOTE — Discharge Instructions (Signed)
Read and follow discharge care instructions.  Take medication as directed pending dental evaluation.

## 2021-03-17 NOTE — ED Provider Notes (Signed)
[facility] Provider Note    Event Date/Time   First MD Initiated Contact with Patient 03/17/21 2018     (approximate)   History   Dental Pain   HPI Debra Wang is a 46 y.o. female patient presents with dental pain and edema to the left lateral jaw that started last night.  Patient states swelling increased today.  Patient states she has a dental appointment for Monday but cannot wait due to the pain and swelling.  Patient has fever associated with complaint.  Patient denies drainage.  Rates the pain as 8/10.  Described pain as "aching".  No palliative measures prior to arrival.     Physical Exam   Triage Vital Signs: ED Triage Vitals [03/17/21 1943]  Enc Vitals Group     BP (!) 151/99     Pulse Rate 87     Resp 18     Temp 99.3 F (37.4 C)     Temp Source Oral     SpO2 96 %     Weight 192 lb (87.1 kg)     Height 5\' 5"  (1.651 m)     Head Circumference      Peak Flow      Pain Score 8     Pain Loc      Pain Edu?      Excl. in GC?     Most recent vital signs: Vitals:   03/17/21 1943  BP: (!) 151/99  Pulse: 87  Resp: 18  Temp: 99.3 F (37.4 C)  SpO2: 96%    General: Awake, moderate distress..  CV:  Good peripheral perfusion.  Resp:  Normal effort.  Abd:  No distention.  Other:  Left lateral mandible edema   ED Results / Procedures / Treatments   Labs (all labs ordered are listed, but only abnormal results are displayed) Labs Reviewed  CBC WITH DIFFERENTIAL/PLATELET - Abnormal; Notable for the following components:      Result Value   WBC 11.3 (*)    Neutro Abs 8.4 (*)    Monocytes Absolute 1.1 (*)    All other components within normal limits  BASIC METABOLIC PANEL - Abnormal; Notable for the following components:   Glucose, Bld 107 (*)    All other components within normal limits     EKG     RADIOLOGY    PROCEDURES:  Critical Care performed: No  Procedures   MEDICATIONS ORDERED IN ED: Medications   clindamycin (CLEOCIN) IVPB 600 mg (600 mg Intravenous New Bag/Given 03/17/21 2121)  ketorolac (TORADOL) 30 MG/ML injection 30 mg (30 mg Intravenous Given 03/17/21 2107)  HYDROmorphone (DILAUDID) injection 1 mg (1 mg Intravenous Given 03/17/21 2108)  ondansetron (ZOFRAN) injection 4 mg (4 mg Intravenous Given 03/17/21 2106)     IMPRESSION / MDM / ASSESSMENT AND PLAN / ED COURSE  I reviewed the triage vital signs and the nursing notes.                              Differential diagnosis includes, but is not limited to, dental abscess, fractured teeth, facial cellulitis.  Due to the patient presentation further evaluation with labs warranted..  Will initiate prophylactic antibiotics of clindamycin, Toradol, and Dilaudid.    FINAL CLINICAL IMPRESSION(S) / ED DIAGNOSES   Final diagnoses:  Dental abscess     Rx / DC Orders   ED Discharge Orders  Ordered    clindamycin (CLEOCIN) 150 MG capsule  4 times daily        03/17/21 2116    oxyCODONE-acetaminophen (PERCOCET) 7.5-325 MG tablet  Every 6 hours PRN        03/17/21 2116             Note:  This document was prepared using Dragon voice recognition software and may include unintentional dictation errors.    Joni Reining, PA-C 03/17/21 2148    Sharman Cheek, MD 03/18/21 [phone]

## 2021-03-17 NOTE — ED Triage Notes (Signed)
Pt with dental pain on left lower jaw since last night. Pain got worse today and pt had increased swelling to left lower jaw. Pt with pingpong ball sized swelling to left lower jaw. PT states she could not wait to Monday to seen dentist due to pain. Pt denies drainage, pt denies fevers.

## 2022-09-01 ENCOUNTER — Emergency Department
Admission: EM | Admit: 2022-09-01 | Discharge: 2022-09-02 | Disposition: A | Discharge status: Home / Self Care | Payer: Medicaid Other | Attending: Emergency Medicine | Admitting: Emergency Medicine

## 2022-09-01 ENCOUNTER — Other Ambulatory Visit: Payer: Self-pay

## 2022-09-01 DIAGNOSIS — R1032 Left lower quadrant pain: Secondary | ICD-10-CM

## 2022-09-01 LAB — CBC WITH DIFFERENTIAL/PLATELET
Abs Immature Granulocytes: 0.04 10*3/uL (ref 0.00–0.07)
Basophils Absolute: 0.1 10*3/uL (ref 0.0–0.1)
Basophils Relative: 1 %
Eosinophils Absolute: 0.2 10*3/uL (ref 0.0–0.5)
Eosinophils Relative: 2 %
HCT: 41.1 % (ref 36.0–46.0)
Hemoglobin: 14.6 g/dL (ref 12.0–15.0)
Immature Granulocytes: 1 %
Lymphocytes Relative: 20 %
Lymphs Abs: 1.6 10*3/uL (ref 0.7–4.0)
MCH: 31.2 pg (ref 26.0–34.0)
MCHC: 35.5 g/dL (ref 30.0–36.0)
MCV: 87.8 fL (ref 80.0–100.0)
Monocytes Absolute: 0.8 10*3/uL (ref 0.1–1.0)
Monocytes Relative: 10 %
Neutro Abs: 5.5 10*3/uL (ref 1.7–7.7)
Neutrophils Relative %: 66 %
Platelets: 183 10*3/uL (ref 150–400)
RBC: 4.68 MIL/uL (ref 3.87–5.11)
RDW: 12.9 % (ref 11.5–15.5)
WBC: 8.2 10*3/uL (ref 4.0–10.5)
nRBC: 0 % (ref 0.0–0.2)

## 2022-09-01 LAB — COMPREHENSIVE METABOLIC PANEL
ALT: 16 U/L (ref 0–44)
AST: 16 U/L (ref 15–41)
Albumin: 3.6 g/dL (ref 3.5–5.0)
Alkaline Phosphatase: 55 U/L (ref 38–126)
Anion gap: 10 (ref 5–15)
BUN: 11 mg/dL (ref 6–20)
CO2: 19 mmol/L — ABNORMAL LOW (ref 22–32)
Calcium: 8.8 mg/dL — ABNORMAL LOW (ref 8.9–10.3)
Chloride: 106 mmol/L (ref 98–111)
Creatinine, Ser: 0.59 mg/dL (ref 0.44–1.00)
GFR, Estimated: >60 mL/min (ref >60)
Glucose, Bld: 107 mg/dL — ABNORMAL HIGH (ref 70–99)
Potassium: 3.6 mmol/L (ref 3.5–5.1)
Sodium: 135 mmol/L (ref 135–145)
Total Bilirubin: 0.7 mg/dL (ref 0.3–1.2)
Total Protein: 7.2 g/dL (ref 6.5–8.1)

## 2022-09-01 LAB — URINALYSIS, ROUTINE W REFLEX MICROSCOPIC
Bilirubin Urine: NEGATIVE
Glucose, UA: NEGATIVE mg/dL
Hgb urine dipstick: NEGATIVE
Ketones, ur: 5 mg/dL — AB
Leukocytes,Ua: NEGATIVE
Nitrite: NEGATIVE
Protein, ur: NEGATIVE mg/dL
Specific Gravity, Urine: 1.016 (ref 1.005–1.030)
pH: 5 (ref 5.0–8.0)

## 2022-09-01 LAB — LIPASE, BLOOD: Lipase: 31 U/L (ref 11–51)

## 2022-09-01 MED ORDER — ONDANSETRON HCL 4 MG/2ML IJ SOLN
4.0000 mg | INTRAMUSCULAR | Status: AC
  Administered 2022-09-02: 4 mg via INTRAVENOUS
  Filled 2022-09-01: qty 2

## 2022-09-01 MED ORDER — MORPHINE SULFATE (PF) 4 MG/ML IV SOLN
4.0000 mg | Freq: Once | INTRAVENOUS | Status: AC
  Administered 2022-09-02: 4 mg via INTRAVENOUS
  Filled 2022-09-01: qty 1

## 2022-09-01 MED ORDER — KETOROLAC TROMETHAMINE 30 MG/ML IJ SOLN
15.0000 mg | Freq: Once | INTRAMUSCULAR | Status: AC
  Administered 2022-09-02: 15 mg via INTRAVENOUS
  Filled 2022-09-01: qty 1

## 2022-09-01 NOTE — ED Triage Notes (Signed)
Pt states she has had left side flank pain x3 days pt was seen at [facility] and prescribed nitrofurantoin, pt states pain has gotten worse and is now also hurting in her lower abd. Pt has pain with urination and having a BM.

## 2022-09-02 ENCOUNTER — Emergency Department: Payer: Medicaid Other

## 2022-09-02 MED ORDER — IOHEXOL 300 MG/ML  SOLN
100.0000 mL | Freq: Once | INTRAMUSCULAR | Status: AC | PRN
  Administered 2022-09-02: 100 mL via INTRAVENOUS

## 2022-09-02 NOTE — Discharge Instructions (Signed)

## 2022-09-02 NOTE — ED Provider Notes (Signed)
[facility] Provider Note    Event Date/Time   First MD Initiated Contact with Patient 09/01/22 2329     (approximate)   History   Flank Pain   HPI TAKESHIA Wang is a 47 y.o. female who is for evaluation of 3 days of left lower quadrant pain.  She said that she is also having some pain with urination but it is not burning pain, it is more pain on the inside.  She is also having pain when she has a bowel movement but her bowel movements are otherwise normal.  She was seen at an urgent care and started on nitrofurantoin for a urinary tract infection, but she said it feels different than that and the pain is steadily gotten worse despite the antibiotics.  She has had no nausea nor vomiting.  No fever of which she is aware.  No chest pain or shortness of breath.     Physical Exam   Triage Vital Signs: ED Triage Vitals  Encounter Vitals Group     BP 09/01/22 2040 107/82     Systolic BP Percentile --      Diastolic BP Percentile --      Pulse Rate 09/01/22 2040 98     Resp 09/01/22 2040 18     Temp 09/01/22 2040 98.4 F (36.9 C)     Temp Source 09/01/22 2040 Oral     SpO2 09/01/22 2040 99 %     Weight 09/01/22 2041 81.6 kg (180 lb)     Height 09/01/22 2041 1.651 m (5\' 5" )     Head Circumference --      Peak Flow --      Pain Score 09/01/22 2041 8     Pain Loc --      Pain Education --      Exclude from Growth Chart --     Most recent vital signs: Vitals:   09/01/22 2040  BP: 107/82  Pulse: 98  Resp: 18  Temp: 98.4 F (36.9 C)  SpO2: 99%    General: Awake, no distress but appears a little bit uncomfortable. CV:  Good peripheral perfusion.  Regular rate and rhythm. Resp:  Normal effort. Speaking easily and comfortably, no accessory muscle usage nor intercostal retractions.   Abd:  No distention.  Tenderness to palpation in the left lower quadrant with no guarding.  No epigastric nor right upper quadrant tenderness, negative Murphy sign.  No  rebound.  No peritonitis.   ED Results / Procedures / Treatments   Labs (all labs ordered are listed, but only abnormal results are displayed) Labs Reviewed  COMPREHENSIVE METABOLIC PANEL - Abnormal; Notable for the following components:      Result Value   CO2 19 (*)    Glucose, Bld 107 (*)    Calcium 8.8 (*)    All other components within normal limits  URINALYSIS, ROUTINE W REFLEX MICROSCOPIC - Abnormal; Notable for the following components:   Color, Urine AMBER (*)    APPearance CLEAR (*)    Ketones, ur 5 (*)    All other components within normal limits  CBC WITH DIFFERENTIAL/PLATELET  LIPASE, BLOOD  PREGNANCY, URINE     RADIOLOGY I viewed and interpreted the patient's CT of the abdomen and pelvis.  See hospital course for details, but no acute findings are present   PROCEDURES:  Critical Care performed: No  Procedures    IMPRESSION / MDM / ASSESSMENT AND PLAN / ED COURSE  I reviewed the triage vital signs and the nursing notes.                              Differential diagnosis includes, but is not limited to, diverticulitis, renal/ureteral colic, UTI/pyelonephritis, musculoskeletal strain, SBO/ileus.  Patient's presentation is most consistent with acute presentation with potential threat to life or bodily function.  Labs/studies ordered: CT abdomen pelvis, EKG, urinalysis, CMP, lipase, CBC with differential, urine pregnancy test  Interventions/Medications given:  Medications  morphine (PF) 4 MG/ML injection 4 mg (4 mg Intravenous Given 09/02/22 0001)  ondansetron (ZOFRAN) injection 4 mg (4 mg Intravenous Given 09/02/22 0001)  ketorolac (TORADOL) 30 MG/ML injection 15 mg (15 mg Intravenous Given 09/02/22 0001)  iohexol (OMNIPAQUE) 300 MG/ML solution 100 mL (100 mLs Intravenous Contrast Given 09/02/22 0013)    (Note:  hospital course my include additional interventions and/or labs/studies not listed above.)   The patient has tenderness in the left lower  quadrant but no peritonitis.  Her urinalysis tonight is essentially normal but she has been on nitrofurantoin so could have sterilize the urine.  However if that is the case then it is likely accomplishing its goal.  Given the history of present illness, I feel it is important to rule out diverticulitis.  I ordered morphine 4 mg IV, Zofran 4 mg IV, and Toradol 15 mg IV to help with her pain and to prevent nausea.  Signs are stable and within normal limits and labs are all within normal limits which is reassuring.  CT of the abdomen and pelvis is normal with no evidence of any acute pathology to explain the patient's pain.  I updated the patient and gave her the usual and customary abdominal pain return precautions.  She understands and agrees with the plan.       FINAL CLINICAL IMPRESSION(S) / ED DIAGNOSES   Final diagnoses:  LLQ abdominal pain     Rx / DC Orders   ED Discharge Orders     None        Note:  This document was prepared using Dragon voice recognition software and may include unintentional dictation errors.   Loleta Rose, MD 09/02/22 [phone]

## 2023-05-16 ENCOUNTER — Ambulatory Visit: Admitting: Physician Assistant

## 2023-05-16 ENCOUNTER — Encounter: Payer: Self-pay | Admitting: Physician Assistant

## 2023-05-16 VITALS — BP 110/74 | HR 83 | Resp 16 | Ht 65.0 in | Wt 168.9 lb

## 2023-05-16 DIAGNOSIS — E7849 Other hyperlipidemia: Secondary | ICD-10-CM

## 2023-05-16 DIAGNOSIS — R053 Chronic cough: Secondary | ICD-10-CM

## 2023-05-16 DIAGNOSIS — K219 Gastro-esophageal reflux disease without esophagitis: Secondary | ICD-10-CM

## 2023-05-16 DIAGNOSIS — G8929 Other chronic pain: Secondary | ICD-10-CM

## 2023-05-16 DIAGNOSIS — M79604 Pain in right leg: Secondary | ICD-10-CM

## 2023-05-16 DIAGNOSIS — G629 Polyneuropathy, unspecified: Secondary | ICD-10-CM

## 2023-05-16 DIAGNOSIS — J302 Other seasonal allergic rhinitis: Secondary | ICD-10-CM

## 2023-05-16 DIAGNOSIS — Z7689 Persons encountering health services in other specified circumstances: Secondary | ICD-10-CM

## 2023-05-16 DIAGNOSIS — M549 Dorsalgia, unspecified: Secondary | ICD-10-CM

## 2023-05-16 DIAGNOSIS — M79605 Pain in left leg: Secondary | ICD-10-CM

## 2023-05-16 DIAGNOSIS — I10 Essential (primary) hypertension: Secondary | ICD-10-CM | POA: Diagnosis not present

## 2023-05-16 DIAGNOSIS — E038 Other specified hypothyroidism: Secondary | ICD-10-CM

## 2023-05-16 DIAGNOSIS — F339 Major depressive disorder, recurrent, unspecified: Secondary | ICD-10-CM

## 2023-05-16 DIAGNOSIS — O99334 Smoking (tobacco) complicating childbirth: Secondary | ICD-10-CM

## 2023-05-16 MED ORDER — LISINOPRIL 20 MG PO TABS: 20.0000 mg | ORAL_TABLET | Freq: Every day | ORAL | 1 refills | Status: DC

## 2023-05-16 MED ORDER — EZETIMIBE 10 MG PO TABS: 10.0000 mg | ORAL_TABLET | Freq: Every evening | ORAL | 1 refills | Status: DC

## 2023-05-16 MED ORDER — PANTOPRAZOLE SODIUM 20 MG PO TBEC: 20.0000 mg | DELAYED_RELEASE_TABLET | Freq: Every day | ORAL | 1 refills | Status: DC

## 2023-05-16 NOTE — Progress Notes (Signed)
 New patient visit  Patient: Debra Wang   DOB: [birth year]   48 y.o. Female  MRN: [id number] Visit Date: 05/16/2023  Today's healthcare provider: Blane Bunting, PA-C   Chief Complaint  Patient presents with   New Patient (Initial Visit)    NP/ With cold/cough NEG on all testing ( more allergies than anythng) Leg pain all the time with throbbing/jumping   Subjective    Debra Wang is a 48 y.o. female who presents today as a new patient to establish care.   Discussed the use of AI scribe software for clinical note transcription with the patient, who gave verbal consent to proceed.  History of Present Illness The patient, a new patient with a history of COPD, hypertension, and hyperlipidemia, presents with a chronic cough, leg pain, and acid reflux. The patient has been experiencing a cough for several months, which she initially attributed to allergies. She has been self-treating with over-the-counter Mucinex. The patient has a history of smoking one pack a day for many years. The cough began after a cold and has persisted since.  The patient also reports leg pain, described as a throbbing ache and muscle spasms, which has been present for several months. The pain is not localized to the joints but feels more muscular and is present throughout the day. The patient also reports a sensation of her legs "jumping" or experiencing spasms. 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  In addition to the cough and leg pain, the patient also reports experiencing acid reflux. She describes a sensation of acid coming up into her mouth, which has been a long-standing issue. The patient has not eaten anything prior to the appointment and has been drinking fluids.  The patient is currently taking lisinopril for hypertension, Zetia for hyperlipidemia, and albuterol  for COPD. She also reports having an IUD and having had her tubes tied.    Past Medical History:   Diagnosis Date   Anxiety    Anxiety    Depression    Depression    Hemorrhoid    UTI (lower urinary tract infection)    UTI (lower urinary tract infection)    Past Surgical History:  Procedure Laterality Date   TUBAL LIGATION  2009   TUBAL LIGATION     Family Status  Relation Name Status   Mother  Alive   Father  Deceased   Other Great Aunt Alive  No partnership data on file   Family History  Problem Relation Age of Onset   Diabetes Mother    Colon cancer Father    Breast cancer Other    Ovarian cancer Other    Social History   Socioeconomic History   Marital status: Married    Spouse name: Not on file   Number of children: Not on file   Years of education: Not on file   Highest education level: Not on file  Occupational History   Not on file  Tobacco Use   Smoking status: Every Day    Current packs/day: 0.50    Types: Cigarettes   Smokeless tobacco: Never  Vaping Use   Vaping status: Never Used  Substance and Sexual Activity   Alcohol use: No   Drug use: No   Sexual activity: Yes    Birth control/protection: Surgical  Other Topics Concern   Not on file  Social History Narrative   Not on file   Social Drivers of Health  Financial Resource Strain: Not on file  Food Insecurity: Not on file  Transportation Needs: Not on file  Physical Activity: Not on file  Stress: Not on file  Social Connections: Not on file   Outpatient Medications Prior to Visit  Medication Sig   albuterol  (VENTOLIN  HFA) 108 (90 Base) MCG/ACT inhaler Inhale 2 puffs into the lungs every 6 (six) hours as needed for wheezing or shortness of breath.   cetirizine  (ZYRTEC ) 10 MG tablet 1 TAB(S) ORAL EVERY EVENING   citalopram (CELEXA) 40 MG tablet Take 40 mg by mouth daily.   clonazePAM (KLONOPIN) 0.5 MG tablet Take 0.5 mg by mouth at bedtime.   ezetimibe (ZETIA) 10 MG tablet every evening.   fluticasone  (FLONASE ) 50 MCG/ACT nasal spray Place 1 spray into both nostrils 2 (two) times  daily.   ibuprofen  (ADVIL ,MOTRIN ) 400 MG tablet Take 1 tablet (400 mg total) by mouth every 6 (six) hours as needed.   ondansetron  (ZOFRAN  ODT) 4 MG disintegrating tablet Take 1 tablet (4 mg total) by mouth every 8 (eight) hours as needed for nausea or vomiting.   oxyCODONE -acetaminophen  (PERCOCET) 7.5-325 MG tablet Take 1 tablet by mouth every 6 (six) hours as needed for severe pain.   pantoprazole  (PROTONIX ) 20 MG tablet Take 1 tablet (20 mg total) by mouth daily.   predniSONE  (DELTASONE ) 10 MG tablet Take 5 tablets tomorrow, on day 2 take 4 tablets, day 3 take 3 tablets, day 4 take 2 tablets, day 5 take 1 tablets   topiramate (TOPAMAX) 25 MG tablet Take 25 mg by mouth daily.   traZODone (DESYREL) 50 MG tablet Take 50 mg by mouth at bedtime. prn   No facility-administered medications prior to visit.   Allergies  Allergen Reactions   Benadryl [Diphenhydramine Hcl (Sleep)]     Seizures    Diphenhydramine Other (See Comments)    Sort of like a seizure, but not a seizure, disoriented   Elemental Sulfur Itching   Sulfa Antibiotics Nausea And Vomiting and Rash     There is no immunization history on file for this patient.  Health Maintenance  Topic Date Due   HIV Screening  Never done   Hepatitis C Screening  Never done   DTaP/Tdap/Td (1 - Tdap) Never done   Pneumococcal Vaccine 74-70 Years old (1 of 2 - PCV) Never done   Cervical Cancer Screening (HPV/Pap Cotest)  Never done   Colonoscopy  Never done   COVID-19 Vaccine (1 - 2024-25 season) Never done   INFLUENZA VACCINE  08/22/2023   HPV VACCINES  Aged Out   Meningococcal B Vaccine  Aged Out    Patient Care Team: Margarette Vannatter, PA-C as PCP - General (Physician Assistant)  Review of Systems  All other systems reviewed and are negative.  Except see HPI       Objective    BP 110/74 (BP Location: Left Arm, Patient Position: Sitting)   Pulse 83   Resp 16   Ht 5\' 5"  (1.651 m)   Wt 168 lb 14.4 oz (76.6 kg)   SpO2 99%    BMI 28.11 kg/m     Physical Exam Vitals reviewed.  Constitutional:      General: She is not in acute distress.    Appearance: Normal appearance. She is well-developed. She is not diaphoretic.  HENT:     Head: Normocephalic and atraumatic.  Eyes:     General: No scleral icterus.    Conjunctiva/sclera: Conjunctivae normal.  Neck:  Thyroid: No thyromegaly.  Cardiovascular:     Rate and Rhythm: Normal rate and regular rhythm.     Pulses: Normal pulses.     Heart sounds: Normal heart sounds. No murmur heard. Pulmonary:     Effort: Pulmonary effort is normal. No respiratory distress.     Breath sounds: Normal breath sounds. No wheezing, rhonchi or rales.  Musculoskeletal:     Cervical back: Neck supple.     Right lower leg: No edema.     Left lower leg: No edema.  Lymphadenopathy:     Cervical: No cervical adenopathy.  Skin:    General: Skin is warm and dry.     Findings: No rash.  Neurological:     Mental Status: She is alert and oriented to person, place, and time. Mental status is at baseline.  Psychiatric:        Mood and Affect: Mood normal.        Behavior: Behavior normal.     Depression Screen     No data to display         No results found for any visits on 05/16/23.  Assessment & Plan      Assessment & Plan  Smoking (tobacco) complicating childbirth chronic Patient was advised to quit smoking  Will reassess at the next appt  Seasonal allergies - Avoidance measures discussed. - Use nasal saline rinses before nose sprays such as with Neilmed Sinus Rinse bottle.  Use distilled water.   - Use OTC Flonase  2 sprays each nostril daily. Aim upward and outward. - Use OTC Zyrtec  10 mg daily.   Primary hypertension - TSH - Lipid panel - Comprehensive metabolic panel with GFR - CBC with Differential/Platelet - Hemoglobin A1c - lisinopril (ZESTRIL) 20 MG tablet; Take 1 tablet (20 mg total) by mouth daily.  Dispense: 30 tablet; Refill: 1  Other  hyperlipidemia - TSH - Lipid panel - Comprehensive metabolic panel with GFR - CBC with Differential/Platelet - Hemoglobin A1c - ezetimibe (ZETIA) 10 MG tablet; Take 1 tablet (10 mg total) by mouth every evening.  Dispense: 30 tablet; Refill: 1  Gastroesophageal reflux disease without esophagitis - pantoprazole  (PROTONIX ) 20 MG tablet; Take 1 tablet (20 mg total) by mouth daily.  Dispense: 30 tablet; Refill: 1  Chronic obstructive pulmonary disease (COPD) COPD confirmed by imaging. Smoking exacerbates symptoms. She struggles to quit due to stress. - Discuss smoking cessation options.  Chronic cough Chronic cough possibly related to allergies or COPD. Previous bronchitis diagnosis, unresolved with OTC medications. - Request release of medical records from previous provider for further evaluation.  Hypertension Chronic  hypertension well-controlled with lisinopril. - Prescribe lisinopril 20 mg once daily. Continue with/lifestyle modifications We will follow-up  Hyperlipidemia Chronic  managed with Zetia. - Prescribe Zetia once daily. Continue with lifestyle modifications We will follow-up  Hypothyroidism Chronic  managed with levothyroxine. No symptoms discussed. Update TSH Will follow-up  Depression Chronic  managed at [facility]. No changes in management.  Gastroesophageal reflux disease (GERD) Chronic  experiences acid reflux with regurgitation. Advised on dietary and positional changes. - Prescribe a proton pump inhibitor. - Advise to elevate head while sleeping. Will follow-up  Chronic back pain Chronic back pain possibly radiating to legs. Previous imaging showed issues. Considering repeat imaging if symptoms persist. - Discuss possibility of repeat imaging if symptoms persist. - Advise on exercise and stretching to alleviate symptoms. Will follow-up  Leg pain/spasms Leg pain and muscle spasms likely due to venous insufficiency from prolonged  standing and walking. No swelling noted. - Advise wearing compression stockings. - Recommend leg elevation when resting. - Encourage stretching exercises before work.  Encounter to establish care Welcomed to our clinic Reviewed past medical hx, social hx, family hx and surgical hx Pt advised to send all vaccination records or screening   No follow-ups on file.    The patient was advised to call back or seek an in-person evaluation if the symptoms worsen or if the condition fails to improve as anticipated.  I discussed the assessment and treatment plan with the patient. The patient was provided an opportunity to ask questions and all were answered. The patient agreed with the plan and demonstrated an understanding of the instructions.  I, Shaiden Aldous, PA-C have reviewed all documentation for this visit. The documentation on  05/16/2023   for the exam, diagnosis, procedures, and orders are all accurate and complete.  Blane Bunting, [facility], MMS [facility] [phone] (phone) [phone] (fax)  [facility]

## 2023-05-17 LAB — LIPID PANEL
Chol/HDL Ratio: 3.5 ratio (ref 0.0–4.4)
Cholesterol, Total: 170 mg/dL (ref 100–199)
HDL: 49 mg/dL (ref >39)
LDL Chol Calc (NIH): 110 mg/dL — ABNORMAL HIGH (ref 0–99)
Triglycerides: 56 mg/dL (ref 0–149)
VLDL Cholesterol Cal: 11 mg/dL (ref 5–40)

## 2023-05-17 LAB — COMPREHENSIVE METABOLIC PANEL WITH GFR
ALT: 14 IU/L (ref 0–32)
AST: 16 IU/L (ref 0–40)
Albumin: 4.2 g/dL (ref 3.9–4.9)
Alkaline Phosphatase: 59 IU/L (ref 44–121)
BUN/Creatinine Ratio: 16 (ref 9–23)
BUN: 11 mg/dL (ref 6–24)
Bilirubin Total: 0.2 mg/dL (ref 0.0–1.2)
CO2: 22 mmol/L (ref 20–29)
Calcium: 9.1 mg/dL (ref 8.7–10.2)
Chloride: 106 mmol/L (ref 96–106)
Creatinine, Ser: 0.68 mg/dL (ref 0.57–1.00)
Globulin, Total: 2.2 g/dL (ref 1.5–4.5)
Glucose: 89 mg/dL (ref 70–99)
Potassium: 4.2 mmol/L (ref 3.5–5.2)
Sodium: 138 mmol/L (ref 134–144)
Total Protein: 6.4 g/dL (ref 6.0–8.5)
eGFR: 108 mL/min/{1.73_m2} (ref >59)

## 2023-05-17 LAB — CBC WITH DIFFERENTIAL/PLATELET
Basophils Absolute: 0.1 10*3/uL (ref 0.0–0.2)
Basos: 1 %
EOS (ABSOLUTE): 0.2 10*3/uL (ref 0.0–0.4)
Eos: 2 %
Hematocrit: 41.6 % (ref 34.0–46.6)
Hemoglobin: 14.4 g/dL (ref 11.1–15.9)
Immature Grans (Abs): 0 10*3/uL (ref 0.0–0.1)
Immature Granulocytes: 1 %
Lymphocytes Absolute: 1.6 10*3/uL (ref 0.7–3.1)
Lymphs: 20 %
MCH: 31.1 pg (ref 26.6–33.0)
MCHC: 34.6 g/dL (ref 31.5–35.7)
MCV: 90 fL (ref 79–97)
Monocytes Absolute: 0.7 10*3/uL (ref 0.1–0.9)
Monocytes: 8 %
Neutrophils Absolute: 5.6 10*3/uL (ref 1.4–7.0)
Neutrophils: 68 %
Platelets: 184 10*3/uL (ref 150–450)
RBC: 4.63 x10E6/uL (ref 3.77–5.28)
RDW: 12.5 % (ref 11.7–15.4)
WBC: 8.1 10*3/uL (ref 3.4–10.8)

## 2023-05-17 LAB — TSH: TSH: 4.21 u[IU]/mL (ref 0.450–4.500)

## 2023-05-17 LAB — VITAMIN B12: Vitamin B-12: 537 pg/mL (ref 232–1245)

## 2023-05-17 LAB — HEMOGLOBIN A1C
Est. average glucose Bld gHb Est-mCnc: 97 mg/dL
Hgb A1c MFr Bld: 5 % (ref 4.8–5.6)

## 2023-05-17 LAB — MAGNESIUM: Magnesium: 2.1 mg/dL (ref 1.6–2.3)

## 2023-05-19 DIAGNOSIS — I10 Essential (primary) hypertension: Secondary | ICD-10-CM | POA: Insufficient documentation

## 2023-05-19 DIAGNOSIS — R053 Chronic cough: Secondary | ICD-10-CM | POA: Insufficient documentation

## 2023-05-19 DIAGNOSIS — K219 Gastro-esophageal reflux disease without esophagitis: Secondary | ICD-10-CM | POA: Insufficient documentation

## 2023-05-19 DIAGNOSIS — J302 Other seasonal allergic rhinitis: Secondary | ICD-10-CM | POA: Insufficient documentation

## 2023-05-19 DIAGNOSIS — O99334 Smoking (tobacco) complicating childbirth: Secondary | ICD-10-CM | POA: Insufficient documentation

## 2023-05-19 DIAGNOSIS — F339 Major depressive disorder, recurrent, unspecified: Secondary | ICD-10-CM | POA: Insufficient documentation

## 2023-05-19 DIAGNOSIS — M79605 Pain in left leg: Secondary | ICD-10-CM | POA: Insufficient documentation

## 2023-05-19 DIAGNOSIS — G8929 Other chronic pain: Secondary | ICD-10-CM | POA: Insufficient documentation

## 2023-05-19 DIAGNOSIS — E038 Other specified hypothyroidism: Secondary | ICD-10-CM | POA: Insufficient documentation

## 2023-05-19 DIAGNOSIS — E7849 Other hyperlipidemia: Secondary | ICD-10-CM | POA: Insufficient documentation

## 2023-05-20 NOTE — Progress Notes (Signed)
 All labs wnl except a slight elevation in cholesterol level. Advised low cholesterol diet and regular exercise. We will reassess at the follow-up

## 2023-05-21 ENCOUNTER — Telehealth: Payer: Self-pay

## 2023-05-21 ENCOUNTER — Other Ambulatory Visit: Payer: Self-pay

## 2023-05-21 MED ORDER — LEVOTHYROXINE SODIUM 50 MCG PO TABS: 50.0000 ug | ORAL_TABLET | Freq: Every day | ORAL | 0 refills | Status: DC

## 2023-05-21 NOTE — Telephone Encounter (Signed)
 Debra Wang

## 2023-06-17 ENCOUNTER — Ambulatory Visit: Admitting: Physician Assistant

## 2023-06-17 ENCOUNTER — Encounter: Payer: Self-pay | Admitting: Physician Assistant

## 2023-06-17 VITALS — BP 106/72 | HR 84 | Resp 16 | Ht 65.0 in | Wt 166.0 lb

## 2023-06-17 DIAGNOSIS — I10 Essential (primary) hypertension: Secondary | ICD-10-CM

## 2023-06-17 DIAGNOSIS — M62838 Other muscle spasm: Secondary | ICD-10-CM

## 2023-06-17 DIAGNOSIS — E7849 Other hyperlipidemia: Secondary | ICD-10-CM

## 2023-06-17 DIAGNOSIS — K219 Gastro-esophageal reflux disease without esophagitis: Secondary | ICD-10-CM

## 2023-06-17 DIAGNOSIS — G629 Polyneuropathy, unspecified: Secondary | ICD-10-CM

## 2023-06-17 DIAGNOSIS — J302 Other seasonal allergic rhinitis: Secondary | ICD-10-CM | POA: Diagnosis not present

## 2023-06-17 DIAGNOSIS — R053 Chronic cough: Secondary | ICD-10-CM

## 2023-06-17 DIAGNOSIS — E038 Other specified hypothyroidism: Secondary | ICD-10-CM

## 2023-06-17 DIAGNOSIS — O99334 Smoking (tobacco) complicating childbirth: Secondary | ICD-10-CM

## 2023-06-17 DIAGNOSIS — F17200 Nicotine dependence, unspecified, uncomplicated: Secondary | ICD-10-CM | POA: Diagnosis not present

## 2023-06-17 MED ORDER — PANTOPRAZOLE SODIUM 20 MG PO TBEC: 20.0000 mg | DELAYED_RELEASE_TABLET | Freq: Every day | ORAL | 1 refills | Status: AC

## 2023-06-17 MED ORDER — FLUTICASONE PROPIONATE 50 MCG/ACT NA SUSP: 1.0000 | Freq: Two times a day (BID) | NASAL | 0 refills | Status: DC

## 2023-06-17 MED ORDER — EZETIMIBE 10 MG PO TABS: 10.0000 mg | ORAL_TABLET | Freq: Every evening | ORAL | 1 refills | Status: AC

## 2023-06-17 MED ORDER — LISINOPRIL 20 MG PO TABS: 20.0000 mg | ORAL_TABLET | Freq: Every day | ORAL | 1 refills | Status: AC

## 2023-06-17 MED ORDER — CETIRIZINE HCL 10 MG PO TABS: 10.0000 mg | ORAL_TABLET | Freq: Every day | ORAL | 5 refills | Status: AC

## 2023-06-17 NOTE — Progress Notes (Signed)
 " Established patient visit  Patient: Debra Wang   DOB: Dec 21, 1975   48 y.o. Female  MRN: [id number] Visit Date: 06/17/2023  Today's healthcare provider: Jolynn Spencer, PA-C   Chief Complaint  Patient presents with   Follow-up    ! Month f/u HTN . No other concerns   Subjective     Discussed the use of AI scribe software for clinical note transcription with the patient, who gave verbal consent to proceed.  History of Present Illness Debra Wang is a 48 year old female with allergies and hypertension who presents with sinus issues and leg discomfort.  She experiences significant sinus issues due to allergies, including coughing, sneezing, post-nasal drainage, and intermittent ear blockage. She avoids antihistamines like Zyrtec  due to drying effects but has used Flonase  with good results. Her nose feels very dry.  She has leg discomfort for the past four to five months, with throbbing, jumping, and tingling sensations. She is concerned about restless leg syndrome, a condition her mother had. Exercises and changing shoes have not alleviated her symptoms. She has not increased physical activity or standing at work.  Her medical history includes hypertension managed with lisinopril , hyperlipidemia managed with Zetia , and thyroid issues managed with medication. She is on Topiramax and citalopram  for behavioral health, managed by [facility]. She smokes and is trying to cut back.   The 10-year ASCVD risk score (Arnett DK, et al., 2019) is: 2.6%     05/16/2023   10:24 AM  Depression screen PHQ 2/9  Decreased Interest 0  Down, Depressed, Hopeless 0  PHQ - 2 Score 0  Altered sleeping 0  Tired, decreased energy 0  Change in appetite 0  Feeling bad or failure about yourself  0  Trouble concentrating 0  Moving slowly or fidgety/restless 0  Suicidal thoughts 0  PHQ-9 Score 0  Difficult doing work/chores Not difficult at all       No data to display           Medications: Outpatient Medications Prior to Visit  Medication Sig   albuterol  (VENTOLIN  HFA) 108 (90 Base) MCG/ACT inhaler Inhale 2 puffs into the lungs every 6 (six) hours as needed for wheezing or shortness of breath.   cetirizine  (ZYRTEC ) 10 MG tablet 1 TAB(S) ORAL EVERY EVENING   citalopram  (CELEXA ) 40 MG tablet Take 40 mg by mouth daily.   clonazePAM (KLONOPIN) 0.5 MG tablet Take 0.5 mg by mouth at bedtime.   ezetimibe  (ZETIA ) 10 MG tablet Take 1 tablet (10 mg total) by mouth every evening.   fluticasone  (FLONASE ) 50 MCG/ACT nasal spray Place 1 spray into both nostrils 2 (two) times daily.   ibuprofen  (ADVIL ,MOTRIN ) 400 MG tablet Take 1 tablet (400 mg total) by mouth every 6 (six) hours as needed.   levothyroxine  (SYNTHROID ) 50 MCG tablet Take 1 tablet (50 mcg total) by mouth daily.   lisinopril  (ZESTRIL ) 20 MG tablet Take 1 tablet (20 mg total) by mouth daily.   ondansetron  (ZOFRAN  ODT) 4 MG disintegrating tablet Take 1 tablet (4 mg total) by mouth every 8 (eight) hours as needed for nausea or vomiting.   oxyCODONE -acetaminophen  (PERCOCET) 7.5-325 MG tablet Take 1 tablet by mouth every 6 (six) hours as needed for severe pain.   pantoprazole  (PROTONIX ) 20 MG tablet Take 1 tablet (20 mg total) by mouth daily.   predniSONE  (DELTASONE ) 10 MG tablet Take 5 tablets tomorrow, on day 2 take 4 tablets, day 3 take 3 tablets, day 4 take  2 tablets, day 5 take 1 tablets   topiramate  (TOPAMAX ) 25 MG tablet Take 25 mg by mouth daily.   traZODone (DESYREL) 50 MG tablet Take 50 mg by mouth at bedtime. prn   No facility-administered medications prior to visit.    Review of Systems All negative Except see HPI       Objective    BP 106/72 (BP Location: Left Arm, Patient Position: Sitting, Cuff Size: Normal)   Pulse 84   Resp 16   Ht 5' 5 (1.651 m)   Wt 166 lb (75.3 kg)   SpO2 100%   BMI 27.62 kg/m     Physical Exam Vitals reviewed.  Constitutional:      General:  She is not in acute distress.    Appearance: Normal appearance. She is well-developed. She is not diaphoretic.  HENT:     Head: Normocephalic and atraumatic.  Eyes:     General: No scleral icterus.    Conjunctiva/sclera: Conjunctivae normal.  Neck:     Thyroid: No thyromegaly.  Cardiovascular:     Rate and Rhythm: Normal rate and regular rhythm.     Pulses: Normal pulses.     Heart sounds: Normal heart sounds. No murmur heard. Pulmonary:     Effort: Pulmonary effort is normal. No respiratory distress.     Breath sounds: Normal breath sounds. No wheezing, rhonchi or rales.  Musculoskeletal:     Cervical back: Neck supple.     Right lower leg: No edema.     Left lower leg: No edema.  Lymphadenopathy:     Cervical: No cervical adenopathy.  Skin:    General: Skin is warm and dry.     Findings: No rash.  Neurological:     Mental Status: She is alert and oriented to person, place, and time. Mental status is at baseline.  Psychiatric:        Mood and Affect: Mood normal.        Behavior: Behavior normal.     No results found for any visits on 06/17/23.      Assessment & Plan Leg spasms/pain Symptoms suggest restless legs syndrome or other neurological issues/neuropathy. Previous B12 and magnesium tests normal. - Refer to neurology for further evaluation.  Nicotine dependence Continues to smoke. Discussed benefits of smoking cessation for leg issues and overall health. - Encourage smoking cessation.  Allergic rhinitis Symptoms likely due to allergies. Flonase  effective in past. Avoids antihistamines due to dryness. - Prescribe Flonase . - Prescribe cetirizine  for six months. - Recommend nasal saline rinse or gel.  Hypertension Chronic Blood pressure normal on lisinopril . Emphasized medication adherence. - Prescribe lisinopril  for 90 days. Continue low salt diet and exercise Will follow-up  Hyperlipidemia Chronic On Zetia . Lipid levels manageable with diet.  Emphasized dietary management with medication. - Prescribe Zetia  for 90 days. Will follow-up  Hypothyroidism Chronic On thyroid medication. No new issues. Emphasized regular monitoring. - Continue current thyroid medication/levothyroxine  50mcg Will follow-up   Primary hypertension - lisinopril  (ZESTRIL ) 20 MG tablet; Take 1 tablet (20 mg total) by mouth daily.  Dispense: 90 tablet; Refill: 1  Chronic cough Seasonal allergies  - cetirizine  (ZYRTEC ) 10 MG tablet; Take 1 tablet (10 mg total) by mouth daily.  Dispense: 30 tablet; Refill: 5 - fluticasone  (FLONASE ) 50 MCG/ACT nasal spray; Place 1 spray into both nostrils 2 (two) times daily.  Dispense: 16 g; Refill: 0  Other hyperlipidemia  - ezetimibe  (ZETIA ) 10 MG tablet; Take 1 tablet (10 mg total) by  mouth every evening.  Dispense: 90 tablet; Refill: 1  Gastroesophageal reflux disease without esophagitis Chronic and stable Elevate the head of the bed 6-8 inches, avoid recumbency for 3 hours after eating, avoid food as a delayed gastric emptying, weight loss   - pantoprazole  (PROTONIX ) 20 MG tablet; Take 1 tablet (20 mg total) by mouth daily.  Dispense: 90 tablet; Refill: 1 Continue PPI Will follow-up  No orders of the defined types were placed in this encounter.   No follow-ups on file.   The patient was advised to call back or seek an in-person evaluation if the symptoms worsen or if the condition fails to improve as anticipated.  I discussed the assessment and treatment plan with the patient. The patient was provided an opportunity to ask questions and all were answered. The patient agreed with the plan and demonstrated an understanding of the instructions.  I, Dacotah Cabello, PA-C have reviewed all documentation for this visit. The documentation on 06/17/2023  for the exam, diagnosis, procedures, and orders are all accurate and complete.  Jolynn Spencer, [facility], MMS [facility] [phone] (phone) [phone]  (fax)  [facility] "

## 2023-06-18 DIAGNOSIS — M62838 Other muscle spasm: Secondary | ICD-10-CM | POA: Insufficient documentation

## 2023-06-20 ENCOUNTER — Other Ambulatory Visit: Payer: Self-pay | Admitting: Physician Assistant

## 2023-06-20 NOTE — Telephone Encounter (Signed)
 Copied from CRM [id number]. Topic: Clinical - Medication Refill >> Jun 20, 2023  9:16 AM Alysia Jumbo S wrote: Medication: levothyroxine  (SYNTHROID ) 50 MCG tablet  Has the patient contacted their pharmacy? Yes (Agent: If no, request that the patient contact the pharmacy for the refill. If patient does not wish to contact the pharmacy document the reason why and proceed with request.) (Agent: If yes, when and what did the pharmacy advise?)  This is the patient's preferred pharmacy:    [facility] [address] [place] - [address] [place]) [place] [address] Phone: [phone] Fax: [phone]  Is this the correct pharmacy for this prescription? Yes If no, delete pharmacy and type the correct one.   Has the prescription been filled recently? No  Is the patient out of the medication? Yes  Has the patient been seen for an appointment in the last year OR does the patient have an upcoming appointment? Yes  Can we respond through MyChart? No  Agent: Please be advised that Rx refills may take up to 3 business days. We ask that you follow-up with your pharmacy.

## 2023-06-21 MED ORDER — LEVOTHYROXINE SODIUM 50 MCG PO TABS: 50.0000 ug | ORAL_TABLET | Freq: Every day | ORAL | 0 refills | Status: DC

## 2023-06-21 NOTE — Telephone Encounter (Signed)
 Requested Prescriptions  Pending Prescriptions Disp Refills   levothyroxine  (SYNTHROID ) 50 MCG tablet 90 tablet 0    Sig: Take 1 tablet (50 mcg total) by mouth daily.     Endocrinology:  Hypothyroid Agents Failed - 06/21/2023  3:45 PM      Failed - Valid encounter within last 12 months    Recent Outpatient Visits           4 days ago Tobacco use disorder   [place] [facility] [place], [place], PA-C   1 month ago Primary hypertension   [place] [facility] [place], [place], PA-C              Passed - TSH in normal range and within 360 days    TSH  Date Value Ref Range Status  05/16/2023 4.210 0.450 - 4.500 uIU/mL Final

## 2023-09-16 ENCOUNTER — Other Ambulatory Visit (HOSPITAL_COMMUNITY): Payer: Self-pay

## 2023-09-16 ENCOUNTER — Encounter: Payer: Self-pay | Admitting: Physician Assistant

## 2023-09-16 ENCOUNTER — Ambulatory Visit: Admitting: Physician Assistant

## 2023-09-16 VITALS — BP 98/66 | HR 79 | Temp 98.2°F | Ht 65.0 in | Wt 163.1 lb

## 2023-09-16 DIAGNOSIS — Z720 Tobacco use: Secondary | ICD-10-CM

## 2023-09-16 DIAGNOSIS — E7849 Other hyperlipidemia: Secondary | ICD-10-CM

## 2023-09-16 DIAGNOSIS — J302 Other seasonal allergic rhinitis: Secondary | ICD-10-CM

## 2023-09-16 DIAGNOSIS — I10 Essential (primary) hypertension: Secondary | ICD-10-CM

## 2023-09-16 DIAGNOSIS — E038 Other specified hypothyroidism: Secondary | ICD-10-CM

## 2023-09-16 DIAGNOSIS — Z30432 Encounter for removal of intrauterine contraceptive device: Secondary | ICD-10-CM

## 2023-09-16 DIAGNOSIS — H04123 Dry eye syndrome of bilateral lacrimal glands: Secondary | ICD-10-CM

## 2023-09-16 DIAGNOSIS — K219 Gastro-esophageal reflux disease without esophagitis: Secondary | ICD-10-CM

## 2023-09-16 DIAGNOSIS — K588 Other irritable bowel syndrome: Secondary | ICD-10-CM

## 2023-09-16 DIAGNOSIS — R058 Other specified cough: Secondary | ICD-10-CM

## 2023-09-16 MED ORDER — FLUTICASONE PROPIONATE 50 MCG/ACT NA SUSP: 1.0000 | Freq: Two times a day (BID) | NASAL | 0 refills | Status: AC

## 2023-09-16 MED ORDER — LEVOTHYROXINE SODIUM 50 MCG PO TABS: 50.0000 ug | ORAL_TABLET | Freq: Every day | ORAL | 0 refills | Status: DC

## 2023-09-16 MED ORDER — ALBUTEROL SULFATE HFA 108 (90 BASE) MCG/ACT IN AERS: 2.0000 | INHALATION_SPRAY | Freq: Four times a day (QID) | RESPIRATORY_TRACT | 2 refills | Status: AC | PRN

## 2023-09-17 ENCOUNTER — Ambulatory Visit: Payer: Self-pay | Admitting: Physician Assistant

## 2023-09-17 DIAGNOSIS — E038 Other specified hypothyroidism: Secondary | ICD-10-CM

## 2023-09-17 DIAGNOSIS — H04123 Dry eye syndrome of bilateral lacrimal glands: Secondary | ICD-10-CM | POA: Insufficient documentation

## 2023-09-17 DIAGNOSIS — Z720 Tobacco use: Secondary | ICD-10-CM | POA: Insufficient documentation

## 2023-09-17 DIAGNOSIS — R059 Cough, unspecified: Secondary | ICD-10-CM | POA: Insufficient documentation

## 2023-09-17 DIAGNOSIS — K588 Other irritable bowel syndrome: Secondary | ICD-10-CM | POA: Insufficient documentation

## 2023-09-17 LAB — LIPID PANEL
Chol/HDL Ratio: 4.3 ratio (ref 0.0–4.4)
Cholesterol, Total: 179 mg/dL (ref 100–199)
HDL: 42 mg/dL (ref >39)
LDL Chol Calc (NIH): 118 mg/dL — ABNORMAL HIGH (ref 0–99)
Triglycerides: 104 mg/dL (ref 0–149)
VLDL Cholesterol Cal: 19 mg/dL (ref 5–40)

## 2023-09-17 LAB — BASIC METABOLIC PANEL WITH GFR
BUN/Creatinine Ratio: 24 — ABNORMAL HIGH (ref 9–23)
BUN: 16 mg/dL (ref 6–24)
CO2: 20 mmol/L (ref 20–29)
Calcium: 9.6 mg/dL (ref 8.7–10.2)
Chloride: 104 mmol/L (ref 96–106)
Creatinine, Ser: 0.66 mg/dL (ref 0.57–1.00)
Glucose: 81 mg/dL (ref 70–99)
Potassium: 4.3 mmol/L (ref 3.5–5.2)
Sodium: 138 mmol/L (ref 134–144)
eGFR: 108 mL/min/1.73 (ref >59)

## 2023-09-17 LAB — TSH: TSH: 4.66 u[IU]/mL — ABNORMAL HIGH (ref 0.450–4.500)

## 2023-09-17 LAB — T4, FREE: Free T4: 1.17 ng/dL (ref 0.82–1.77)

## 2023-09-17 MED ORDER — BUDESONIDE-FORMOTEROL FUMARATE 160-4.5 MCG/ACT IN AERO
2.0000 | INHALATION_SPRAY | Freq: Every day | RESPIRATORY_TRACT | 1 refills | Status: AC | PRN
Start: 2023-09-17 — End: ?

## 2023-09-17 MED ORDER — LEVOTHYROXINE SODIUM 75 MCG PO TABS: 75.0000 ug | ORAL_TABLET | Freq: Every day | ORAL | 3 refills | Status: AC

## 2023-09-17 NOTE — Progress Notes (Signed)
 Established patient visit  Patient: Debra Wang   DOB: 05-Jan-1976   48 y.o. Female  MRN: [id number] Visit Date: 09/16/2023  Today's healthcare provider: Jolynn Spencer, PA-C   Chief Complaint  Patient presents with   Medical Management of Chronic Issues   Cough    Patient reports ongoing productive cough for over 2 months, would like to see about refilling inhalers     Subjective     HPI     Cough    Additional comments: Patient reports ongoing productive cough for over 2 months, would like to see about refilling inhalers        Last edited by Cherry Chiquita HERO, CMA on 09/16/2023  9:26 AM.       Discussed the use of AI scribe software for clinical note transcription with the patient, who gave verbal consent to proceed.  History of Present Illness Debra Wang is a 47 year old female with COPD who presents with a persistent cough and medication refill requests.  She experiences a persistent cough and has not been using her inhalers. She smokes about a pack a day, with a decrease in smoking when busy or working. She denies shortness of breath or palpitations. Her son insists on refilling her inhalers before returning home.  She has severe allergies with symptoms of dry eyes, a sensation of 'rocks' in her eyes, post-nasal drainage, and a sensation of a 'big bubble' in her nose. She is out of her allergy medication and requests a refill. She has used nasal saline rinses and Flonase  in the past.  She takes levothyroxine  for her thyroid and lisinopril  20 mcg for blood pressure. She needs refills for her cholesterol and blood pressure medications. She does not regularly monitor her blood pressure.  She takes Linzess as needed for irritable bowel syndrome and pantoprazole  for heartburn, requesting a prescription for the latter. She acknowledges the need for a colonoscopy, as she has never had one before. Her family history includes bile duct cancer, with both parents having died from  the condition.       09/16/2023    9:31 AM 06/17/2023    1:19 PM 05/16/2023   10:24 AM  Depression screen PHQ 2/9  Decreased Interest 0 0 0  Down, Depressed, Hopeless 0 0 0  PHQ - 2 Score 0 0 0  Altered sleeping 3 0 0  Tired, decreased energy 3 0 0  Change in appetite  0 0  Feeling bad or failure about yourself  0 0 0  Trouble concentrating 0 0 0  Moving slowly or fidgety/restless 0 0 0  Suicidal thoughts 0 0 0  PHQ-9 Score 6 0 0  Difficult doing work/chores  Not difficult at all Not difficult at all      09/16/2023    9:31 AM 06/17/2023    1:19 PM  GAD 7 : Generalized Anxiety Score  Nervous, Anxious, on Edge 0 0  Control/stop worrying 0 0  Worry too much - different things 0 0  Trouble relaxing 0 0  Restless 0 0  Easily annoyed or irritable 0 0  Afraid - awful might happen 0 0  Total GAD 7 Score 0 0  Anxiety Difficulty  Not difficult at all    Medications: Outpatient Medications Prior to Visit  Medication Sig   budesonide -formoterol  (SYMBICORT ) 160-4.5 MCG/ACT inhaler Inhale 2 puffs into the lungs daily as needed.   cetirizine  (ZYRTEC ) 10 MG tablet Take 1 tablet (10 mg total) by mouth daily.  citalopram (CELEXA) 40 MG tablet Take 40 mg by mouth daily.   ezetimibe  (ZETIA ) 10 MG tablet Take 1 tablet (10 mg total) by mouth every evening.   ibuprofen  (ADVIL ,MOTRIN ) 400 MG tablet Take 1 tablet (400 mg total) by mouth every 6 (six) hours as needed.   linaclotide (LINZESS) 72 MCG capsule Take 72 mcg by mouth daily before breakfast.   lisinopril  (ZESTRIL ) 20 MG tablet Take 1 tablet (20 mg total) by mouth daily.   ondansetron  (ZOFRAN  ODT) 4 MG disintegrating tablet Take 1 tablet (4 mg total) by mouth every 8 (eight) hours as needed for nausea or vomiting.   pantoprazole  (PROTONIX ) 20 MG tablet Take 1 tablet (20 mg total) by mouth daily.   risperiDONE (RISPERDAL) 1 MG tablet Take 1 mg by mouth at bedtime.   topiramate (TOPAMAX) 25 MG tablet Take 25 mg by mouth daily.    traZODone (DESYREL) 50 MG tablet Take 50 mg by mouth at bedtime. prn   [DISCONTINUED] albuterol  (VENTOLIN  HFA) 108 (90 Base) MCG/ACT inhaler Inhale 2 puffs into the lungs every 6 (six) hours as needed for wheezing or shortness of breath.   [DISCONTINUED] fluticasone  (FLONASE ) 50 MCG/ACT nasal spray Place 1 spray into both nostrils 2 (two) times daily.   [DISCONTINUED] levothyroxine  (SYNTHROID ) 50 MCG tablet Take 1 tablet (50 mcg total) by mouth daily.   [DISCONTINUED] clonazePAM (KLONOPIN) 0.5 MG tablet Take 0.5 mg by mouth at bedtime.   [DISCONTINUED] oxyCODONE -acetaminophen  (PERCOCET) 7.5-325 MG tablet Take 1 tablet by mouth every 6 (six) hours as needed for severe pain.   [DISCONTINUED] predniSONE  (DELTASONE ) 10 MG tablet Take 5 tablets tomorrow, on day 2 take 4 tablets, day 3 take 3 tablets, day 4 take 2 tablets, day 5 take 1 tablets   No facility-administered medications prior to visit.    Review of Systems All negative Except see HPI       Objective    BP 98/66 (BP Location: Right Arm, Patient Position: Sitting, Cuff Size: Normal)   Pulse 79   Temp 98.2 F (36.8 C) (Oral)   Ht 5' 5 (1.651 m)   Wt 163 lb 1.6 oz (74 kg)   SpO2 100%   BMI 27.14 kg/m     Physical Exam Vitals reviewed.  Constitutional:      General: She is not in acute distress.    Appearance: Normal appearance. She is well-developed. She is not diaphoretic.  HENT:     Head: Normocephalic and atraumatic.  Eyes:     General: No scleral icterus.    Conjunctiva/sclera: Conjunctivae normal.  Neck:     Thyroid: No thyromegaly.  Cardiovascular:     Rate and Rhythm: Normal rate and regular rhythm.     Pulses: Normal pulses.     Heart sounds: Normal heart sounds. No murmur heard. Pulmonary:     Effort: Pulmonary effort is normal. No respiratory distress.     Breath sounds: Normal breath sounds. No wheezing, rhonchi or rales.  Musculoskeletal:     Cervical back: Neck supple.     Right lower leg: No edema.      Left lower leg: No edema.  Lymphadenopathy:     Cervical: No cervical adenopathy.  Skin:    General: Skin is warm and dry.     Findings: No rash.  Neurological:     Mental Status: She is alert and oriented to person, place, and time. Mental status is at baseline.  Psychiatric:        Mood and  Affect: Mood normal.        Behavior: Behavior normal.      Results for orders placed or performed in visit on 09/16/23  Lipid panel  Result Value Ref Range   Cholesterol, Total 179 100 - 199 mg/dL   Triglycerides 895 0 - 149 mg/dL   HDL 42 >60 mg/dL   VLDL Cholesterol Cal 19 5 - 40 mg/dL   LDL Chol Calc (NIH) 881 (H) 0 - 99 mg/dL   Chol/HDL Ratio 4.3 0.0 - 4.4 ratio  TSH  Result Value Ref Range   TSH 4.660 (H) 0.450 - 4.500 uIU/mL  T4, free  Result Value Ref Range   Free T4 1.17 0.82 - 1.77 ng/dL  Basic metabolic panel with GFR  Result Value Ref Range   Glucose 81 70 - 99 mg/dL   BUN 16 6 - 24 mg/dL   Creatinine, Ser 9.33 0.57 - 1.00 mg/dL   eGFR 891 >40 fO/fpw/8.26   BUN/Creatinine Ratio 24 (H) 9 - 23   Sodium 138 134 - 144 mmol/L   Potassium 4.3 3.5 - 5.2 mmol/L   Chloride 104 96 - 106 mmol/L   CO2 20 20 - 29 mmol/L   Calcium 9.6 8.7 - 10.2 mg/dL        Assessment & Plan Cough  Chronic cough likely due to allergic rhinitis, possible COPD  Smoking may contribute. Son insists on inhaler use. - Prescribe albuterol  inhaler. - Consider Symbicort  inhaler - Consider a referral to pulmonologist. - Advise monitoring for shortness of breath or wheezing, especially at night.  Allergic rhinitis Severe allergic rhinitis with post-nasal drainage and congestion contributing to cough. - Prescribe Zyrtec . Monitor for allergy - Prescribe Flonase . - Recommend nasal saline rinse. - Advise use of moisturizing eye drops. Will follow-up  Primary hypertension Blood pressure low, possibly due to medication. Recent dizziness noted. - Advise home blood pressure monitoring. - Bring  blood pressure device to next appointment. - Schedule follow-up in three months.  Hypothyroidism Currently on levothyroxine . Blood work needed for dosage assessment. - Order TSH test. - Continue current levothyroxine  dosage.  Hyperlipidemia Requires ongoing management. Blood work needed to assess status. - Order lipid panel.  Tobacco use disorder Smoking one pack per day. Cessation advised for respiratory and Will reassess at the next appt   Irritable bowel syndrome Previously managed with Linzess. No current prescription due to lack of samples. - Offer to prescribe Linzess if pt will request.  Gastroesophageal reflux disease (GERD) Managed with pantoprazole . - Continue pantoprazole  as prescribed. Elevate the head of the bed 6-8 inches, avoid recumbency for 3 hours after eating, avoid food as a delayed gastric emptying, weight loss    Dry eye syndrome Experiencing dry eyes, described as feeling like rocks. - Recommend use of moisturizing eye drops and eye drops for itch relief Will follow-up  General Health Maintenance Due for vaccinations and screenings. Family history increases colorectal cancer risk. - Administer flu vaccine. - Administer pneumonia vaccine if eligible. - Administer tetanus shot. - Recommend colonoscopy due to family history of bile duct cancer.  Seasonal allergies  - fluticasone  (FLONASE ) 50 MCG/ACT nasal spray; Place 1 spray into both nostrils 2 (two) times daily.  Dispense: 16 g; Refill: 0  Other cough (Primary) Unclear if due to GERD, chronic bronchitis, postnasal drip - albuterol  (VENTOLIN  HFA) 108 (90 Base) MCG/ACT inhaler; Inhale 2 puffs into the lungs every 6 (six) hours as needed for wheezing or shortness of breath.  Dispense: 8 g; Refill:  2  Encounter for IUD removal  - Ambulatory referral to Obstetrics / Gynecology   Other hyperlipidemia  - levothyroxine  (SYNTHROID ) 50 MCG tablet; Take 1 tablet (50 mcg total) by mouth daily.  Dispense:  90 tablet; Refill: 0 - Lipid panel - TSH - T4, free - Basic metabolic panel with GFR  Orders Placed This Encounter  Procedures   Lipid panel    Has the patient fasted?:   Yes   TSH   T4, free   Basic metabolic panel with GFR    Has the patient fasted?:   Yes   Ambulatory referral to Obstetrics / Gynecology    Referral Priority:   Routine    Referral Type:   Consultation    Referral Reason:   Specialty Services Required    Requested Specialty:   Obstetrics and Gynecology    Number of Visits Requested:   1    Return in about 3 months (around 12/17/2023) for chronic disease f/u.   The patient was advised to call back or seek an in-person evaluation if the symptoms worsen or if the condition fails to improve as anticipated.  I discussed the assessment and treatment plan with the patient. The patient was provided an opportunity to ask questions and all were answered. The patient agreed with the plan and demonstrated an understanding of the instructions.  I, Muntaha Vermette, PA-C have reviewed all documentation for this visit. The documentation on 09/16/2023  for the exam, diagnosis, procedures, and orders are all accurate and complete.  Jolynn Spencer, [facility], MMS [facility] [phone] (phone) [phone] (fax)  [facility]

## 2023-09-17 NOTE — Progress Notes (Signed)
 All  labs are stable except Elevated LDL/bad cholesterol level The 10-year ASCVD risk score (heart attack and stroke) is: 3.1% Advise strict low-cholesterol diet and regular exercise  Elevated TSH level.  Advised to increase levothyroxine  dose to 75 mcg.  Patient was sent to her pharmacy

## 2023-09-17 NOTE — Telephone Encounter (Signed)
 Pt given lab results per notes of Jolynn Spencer, PA-C on 09/17/23. Pt verbalized understanding. She says that she picked up both the 75 mcg and 50 mcg today from the pharmacy and she was confused on what to do. She says she will start on the 75 mcg. Advised to put the 50 mcg up in the cabinet just in case her dosage is changed back and she will have some on hand. She verbalized understanding.    Copied from CRM #[id number]. Topic: Clinical - Medication Question >> Sep 17, 2023  4:27 PM Avram MATSU wrote: Reason for CRM: patient called because her medication was filled for levothyroxine  (SYNTHROID ) 50 MCG tablet [[id number]]  DISCONTINUED and levothyroxine  (SYNTHROID ) 75 MCG tablet [[id number]], She wanted to know if she needs to take both medications because both was filled but for different MG. Please advise [phone]

## 2023-09-19 ENCOUNTER — Other Ambulatory Visit (HOSPITAL_COMMUNITY): Payer: Self-pay

## 2023-10-31 ENCOUNTER — Encounter: Admitting: Advanced Practice Midwife

## 2023-12-01 ENCOUNTER — Ambulatory Visit (INDEPENDENT_AMBULATORY_CARE_PROVIDER_SITE_OTHER): Admitting: Physician Assistant

## 2023-12-01 ENCOUNTER — Encounter: Payer: Self-pay | Admitting: Physician Assistant

## 2023-12-01 VITALS — BP 125/89 | HR 80 | Ht 65.0 in | Wt 165.6 lb

## 2023-12-01 DIAGNOSIS — K5909 Other constipation: Secondary | ICD-10-CM | POA: Diagnosis not present

## 2023-12-01 DIAGNOSIS — K625 Hemorrhage of anus and rectum: Secondary | ICD-10-CM

## 2023-12-01 DIAGNOSIS — K6289 Other specified diseases of anus and rectum: Secondary | ICD-10-CM

## 2023-12-01 MED ORDER — POLYETHYLENE GLYCOL 3350 17 GM/SCOOP PO POWD
17.0000 g | Freq: Two times a day (BID) | ORAL | Status: AC | PRN
Start: 2023-12-01 — End: ?

## 2023-12-01 NOTE — Progress Notes (Signed)
 Established patient visit  Patient: Debra Wang   DOB: [birth year]   48 y.o. Female  MRN: [id number] Visit Date: 12/01/2023  Today's healthcare provider: Jolynn Spencer, PA-C   Chief Complaint  Patient presents with   Blood In Stools    Onset 2 days    Constipation   Subjective      Discussed the use of AI scribe software for clinical note transcription with the patient, who gave verbal consent to proceed.  History of Present Illness Debra Wang is a 48 year old female who presents with rectal bleeding and constipation.  She experiences rectal bleeding, which occurs as dripping blood in the toilet bowl after straining during defecation. The bleeding is painful and accompanied by a burning sensation. Constipation is present, with very hard and painful stools. She attempted to use a laxative to soften her stool, but it has not been effective. There is no diarrhea. She is concerned about her symptoms due to her family history of colon cancer, as both her parents passed away from this condition in their sixties.       09/16/2023    9:31 AM 06/17/2023    1:19 PM 05/16/2023   10:24 AM  Depression screen PHQ 2/9  Decreased Interest 0 0 0  Down, Depressed, Hopeless 0 0 0  PHQ - 2 Score 0 0 0  Altered sleeping 3 0 0  Tired, decreased energy 3 0 0  Change in appetite  0 0  Feeling bad or failure about yourself  0 0 0  Trouble concentrating 0 0 0  Moving slowly or fidgety/restless 0 0 0  Suicidal thoughts 0 0 0  PHQ-9 Score 6  0  0   Difficult doing work/chores  Not difficult at all Not difficult at all     Data saved with a previous flowsheet row definition      09/16/2023    9:31 AM 06/17/2023    1:19 PM  GAD 7 : Generalized Anxiety Score  Nervous, Anxious, on Edge 0 0  Control/stop worrying 0 0  Worry too much - different things 0 0  Trouble relaxing 0 0  Restless 0 0  Easily annoyed or irritable 0 0  Afraid - awful might happen 0 0  Total GAD 7 Score 0 0  Anxiety  Difficulty  Not difficult at all    Medications: Outpatient Medications Prior to Visit  Medication Sig   albuterol  (VENTOLIN  HFA) 108 (90 Base) MCG/ACT inhaler Inhale 2 puffs into the lungs every 6 (six) hours as needed for wheezing or shortness of breath.   budesonide -formoterol  (SYMBICORT ) 160-4.5 MCG/ACT inhaler Inhale 2 puffs into the lungs daily as needed.   cetirizine  (ZYRTEC ) 10 MG tablet Take 1 tablet (10 mg total) by mouth daily.   citalopram (CELEXA) 40 MG tablet Take 40 mg by mouth daily.   ezetimibe  (ZETIA ) 10 MG tablet Take 1 tablet (10 mg total) by mouth every evening.   fluticasone  (FLONASE ) 50 MCG/ACT nasal spray Place 1 spray into both nostrils 2 (two) times daily.   ibuprofen  (ADVIL ,MOTRIN ) 400 MG tablet Take 1 tablet (400 mg total) by mouth every 6 (six) hours as needed.   levothyroxine  (SYNTHROID ) 75 MCG tablet Take 1 tablet (75 mcg total) by mouth daily before breakfast.   linaclotide (LINZESS) 72 MCG capsule Take 72 mcg by mouth daily before breakfast.   lisinopril  (ZESTRIL ) 20 MG tablet Take 1 tablet (20 mg total) by mouth daily.   ondansetron  (ZOFRAN  ODT) 4  MG disintegrating tablet Take 1 tablet (4 mg total) by mouth every 8 (eight) hours as needed for nausea or vomiting.   pantoprazole  (PROTONIX ) 20 MG tablet Take 1 tablet (20 mg total) by mouth daily.   risperiDONE (RISPERDAL) 1 MG tablet Take 1 mg by mouth at bedtime.   topiramate (TOPAMAX) 25 MG tablet Take 25 mg by mouth daily.   traZODone (DESYREL) 50 MG tablet Take 50 mg by mouth at bedtime. prn   No facility-administered medications prior to visit.    Review of Systems All negative Except see HPI       Objective    BP 125/89   Pulse 80   Ht 5' 5 (1.651 m)   Wt 165 lb 9.6 oz (75.1 kg)   SpO2 100%   BMI 27.56 kg/m     Physical Exam Constitutional:      General: She is not in acute distress.    Appearance: Normal appearance.  HENT:     Head: Normocephalic.  Pulmonary:     Effort:  Pulmonary effort is normal. No respiratory distress.  Neurological:     Mental Status: She is alert and oriented to person, place, and time. Mental status is at baseline.      No results found for any visits on 12/01/23.      Assessment & Plan Painful Rectal bleeding Intermittent rectal bleeding with burning sensation. Family history of colon cancer noted. No hemorrhoids on PE - Perform fecal occult blood test at home and bring sample for analysis. - Referred to gastroenterology for further evaluation.  Constipation Chronic constipation with hard stools causing pain. Family history of colon cancer noted. - Increase water intake. - Start Miralax for bowel movement regulation. - Adopt a high fiber diet including probiotics, yogurt, kefir, prune juice, prunes, and dried apricots. - Follow up in one month to assess progress and consider gastroenterology referral if symptoms persist.  Pt was advised: -Eliminate medications that cause constipation. -Increase fluid intake. -Increase soluble fiber (25-30g/day) in diet. -Encourage regular defecation attempts after eating. -Regular exercise -Enemas if other methods fail- Bulking agents (accompanied by adequate fluids)  Psyllium  (Konsyl, Metamucil, Perdiem Fiber): 1 tbsp (approx 3.5 grams fiber) in 8-oz liquid PO daily up to TID Polyethylene glycol (PEG) (MiraLAX) 17 g/day PO dissolved in 4 to 8 oz of beverage (current evidence shows PEG to be superior to lactulose Will RTC if symptoms persist  Will follow-up   No orders of the defined types were placed in this encounter.   No follow-ups on file.   The patient was advised to call back or seek an in-person evaluation if the symptoms worsen or if the condition fails to improve as anticipated.  I discussed the assessment and treatment plan with the patient. The patient was provided an opportunity to ask questions and all were answered. The patient agreed with the plan and demonstrated  an understanding of the instructions.  I, Acie Custis, PA-C have reviewed all documentation for this visit. The documentation on 12/01/2023  for the exam, diagnosis, procedures, and orders are all accurate and complete.  Jolynn Spencer, [facility], MMS [facility] [phone] (phone) [phone] (fax)  [facility]

## 2023-12-03 ENCOUNTER — Ambulatory Visit: Payer: Self-pay | Admitting: Physician Assistant

## 2023-12-03 DIAGNOSIS — K6289 Other specified diseases of anus and rectum: Secondary | ICD-10-CM

## 2023-12-03 LAB — IFOBT (OCCULT BLOOD): IFOBT: POSITIVE

## 2023-12-03 NOTE — Addendum Note (Signed)
 Addended by: LILIAN SEVERO RAMAN on: 12/03/2023 02:34 PM   Modules accepted: Orders

## 2023-12-04 ENCOUNTER — Telehealth: Payer: Self-pay | Admitting: Physician Assistant

## 2023-12-04 NOTE — Telephone Encounter (Signed)
 Spoke with pt under result note

## 2023-12-08 NOTE — Progress Notes (Deleted)
 GYNECOLOGY ANNUAL PHYSICAL EXAM PROGRESS NOTE  Subjective:    Debra Wang is a 48 y.o. G80P2002 female who presents for an annual exam.  The patient {is/is not/has never been:13135} sexually active. The patient participates in regular exercise: {yes/no/not asked:9010}. Has the patient ever been transfused or tattooed?: {yes/no/not asked:9010}. The patient reports that there {is/is not:9024} domestic violence in her life.   The patient has the following complaints today:   Menstrual History: Menarche age: *** No LMP recorded. (Menstrual status: IUD).     Gynecologic History:  Contraception: IUD History of STI's:  Last Pap: ***. Results were: {norm/abn:16337}.  ***Denies/Notes h/o abnormal pap smears. Last mammogram: ***. Results were: {norm/abn:16337}       OB History  Gravida Para Term Preterm AB Living  2 2 2  0 0 2  SAB IAB Ectopic Multiple Live Births  0 0 0 0 2    # Outcome Date GA Lbr Len/2nd Weight Sex Type Anes PTL Lv  2 Term 2009   9 lb 9.6 oz (4.355 kg) M Vag-Spont   LIV  1 Term 1997   7 lb 12.8 oz (3.538 kg) M Vag-Spont   LIV    Past Medical History:  Diagnosis Date   Anxiety    Anxiety    Depression    Depression    Hemorrhoid    UTI (lower urinary tract infection)    UTI (lower urinary tract infection)     Past Surgical History:  Procedure Laterality Date   TUBAL LIGATION  2009   TUBAL LIGATION      Family History  Problem Relation Age of Onset   Diabetes Mother    Colon cancer Father    Breast cancer Other    Ovarian cancer Other     Social History   Socioeconomic History   Marital status: Married    Spouse name: Not on file   Number of children: Not on file   Years of education: Not on file   Highest education level: Not on file  Occupational History   Not on file  Tobacco Use   Smoking status: Every Day    Current packs/day: 0.50    Types: Cigarettes   Smokeless tobacco: Never  Vaping Use   Vaping status: Never Used   Substance and Sexual Activity   Alcohol use: No   Drug use: No   Sexual activity: Yes    Birth control/protection: Surgical  Other Topics Concern   Not on file  Social History Narrative   Not on file   Social Drivers of Health   Financial Resource Strain: Not on file  Food Insecurity: Not on file  Transportation Needs: Not on file  Physical Activity: Not on file  Stress: Not on file  Social Connections: Not on file  Intimate Partner Violence: Not on file    Current Outpatient Medications on File Prior to Visit  Medication Sig Dispense Refill   albuterol  (VENTOLIN  HFA) 108 (90 Base) MCG/ACT inhaler Inhale 2 puffs into the lungs every 6 (six) hours as needed for wheezing or shortness of breath. 8 g 2   budesonide -formoterol  (SYMBICORT ) 160-4.5 MCG/ACT inhaler Inhale 2 puffs into the lungs daily as needed. 10.2 each 1   cetirizine  (ZYRTEC ) 10 MG tablet Take 1 tablet (10 mg total) by mouth daily. 30 tablet 5   citalopram (CELEXA) 40 MG tablet Take 40 mg by mouth daily.     ezetimibe  (ZETIA ) 10 MG tablet Take 1 tablet (10 mg  total) by mouth every evening. 90 tablet 1   fluticasone  (FLONASE ) 50 MCG/ACT nasal spray Place 1 spray into both nostrils 2 (two) times daily. 16 g 0   ibuprofen  (ADVIL ,MOTRIN ) 400 MG tablet Take 1 tablet (400 mg total) by mouth every 6 (six) hours as needed. 30 tablet 0   levothyroxine  (SYNTHROID ) 75 MCG tablet Take 1 tablet (75 mcg total) by mouth daily before breakfast. 90 tablet 3   linaclotide (LINZESS) 72 MCG capsule Take 72 mcg by mouth daily before breakfast.     lisinopril  (ZESTRIL ) 20 MG tablet Take 1 tablet (20 mg total) by mouth daily. 90 tablet 1   ondansetron  (ZOFRAN  ODT) 4 MG disintegrating tablet Take 1 tablet (4 mg total) by mouth every 8 (eight) hours as needed for nausea or vomiting. 20 tablet 0   pantoprazole  (PROTONIX ) 20 MG tablet Take 1 tablet (20 mg total) by mouth daily. 90 tablet 1   polyethylene glycol powder (GLYCOLAX/MIRALAX) 17  GM/SCOOP powder Take 17 g by mouth 2 (two) times daily as needed. Dissolve 1 capful (17g) in 4-8 ounces of liquid and take by mouth daily.     risperiDONE (RISPERDAL) 1 MG tablet Take 1 mg by mouth at bedtime.     topiramate (TOPAMAX) 25 MG tablet Take 25 mg by mouth daily.  2   traZODone (DESYREL) 50 MG tablet Take 50 mg by mouth at bedtime. prn     No current facility-administered medications on file prior to visit.    Allergies  Allergen Reactions   Benadryl [Diphenhydramine Hcl (Sleep)]     Seizures    Diphenhydramine Other (See Comments)    Sort of like a seizure, but not a seizure, disoriented   Elemental Sulfur Itching   Sulfa Antibiotics Nausea And Vomiting and Rash     Review of Systems Constitutional: negative for chills, fatigue, fevers and sweats Eyes: negative for irritation, redness and visual disturbance Ears, nose, mouth, throat, and face: negative for hearing loss, nasal congestion, snoring and tinnitus Respiratory: negative for asthma, cough, sputum Cardiovascular: negative for chest pain, dyspnea, exertional chest pressure/discomfort, irregular heart beat, palpitations and syncope Gastrointestinal: negative for abdominal pain, change in bowel habits, nausea and vomiting Genitourinary: negative for abnormal menstrual periods, genital lesions, sexual problems and vaginal discharge, dysuria and urinary incontinence Integument/breast: negative for breast lump, breast tenderness and nipple discharge Hematologic/lymphatic: negative for bleeding and easy bruising Musculoskeletal:negative for back pain and muscle weakness Neurological: negative for dizziness, headaches, vertigo and weakness Endocrine: negative for diabetic symptoms including polydipsia, polyuria and skin dryness Allergic/Immunologic: negative for hay fever and urticaria      Objective:  There were no vitals taken for this visit. There is no height or weight on file to calculate BMI.    General  Appearance:    Alert, cooperative, no distress, appears stated age  Head:    Normocephalic, without obvious abnormality, atraumatic  Eyes:    PERRL, conjunctiva/corneas clear, EOM's intact, both eyes  Ears:    Normal external ear canals, both ears  Nose:   Nares normal, septum midline, mucosa normal, no drainage or sinus tenderness  Throat:   Lips, mucosa, and tongue normal; teeth and gums normal  Neck:   Supple, symmetrical, trachea midline, no adenopathy; thyroid: no enlargement/tenderness/nodules; no carotid bruit or JVD  Back:     Symmetric, no curvature, ROM normal, no CVA tenderness  Lungs:     Clear to auscultation bilaterally, respirations unlabored  Chest Wall:    No tenderness  or deformity   Heart:    Regular rate and rhythm, S1 and S2 normal, no murmur, rub or gallop  Breast Exam:    No tenderness, masses, or nipple abnormality  Abdomen:     Soft, non-tender, bowel sounds active all four quadrants, no masses, no organomegaly.    Genitalia:    Pelvic:external genitalia normal, vagina without lesions, discharge, or tenderness, rectovaginal septum  normal. Cervix normal in appearance, no cervical motion tenderness, no adnexal masses or tenderness.  Uterus normal size, shape, mobile, regular contours, nontender.  Rectal:    Normal external sphincter.  No hemorrhoids appreciated. Internal exam not done.   Extremities:   Extremities normal, atraumatic, no cyanosis or edema  Pulses:   2+ and symmetric all extremities  Skin:   Skin color, texture, turgor normal, no rashes or lesions  Lymph nodes:   Cervical, supraclavicular, and axillary nodes normal  Neurologic:   CNII-XII intact, normal strength, sensation and reflexes throughout   .  Labs:  Lab Results  Component Value Date   WBC 8.1 05/16/2023   HGB 14.4 05/16/2023   HCT 41.6 05/16/2023   MCV 90 05/16/2023   PLT 184 05/16/2023    Lab Results  Component Value Date   CREATININE 0.66 09/16/2023   BUN 16 09/16/2023   NA 138  09/16/2023   K 4.3 09/16/2023   CL 104 09/16/2023   CO2 20 09/16/2023    Lab Results  Component Value Date   ALT 14 05/16/2023   AST 16 05/16/2023   ALKPHOS 59 05/16/2023   BILITOT 0.2 05/16/2023    Lab Results  Component Value Date   TSH 4.660 (H) 09/16/2023     Assessment:   No diagnosis found.   Plan:  Blood tests: {blood tests:13147}. Breast self exam technique reviewed and patient encouraged to perform self-exam monthly. Contraception: {contraceptive methods:5051}. Discussed healthy lifestyle modifications. Mammogram {discussed/ordered:14545} Pap smear {discussed/ordered:14545}. Flu vaccine: Follow up in 1 year for annual exam   Kizzie Camelia CROME, CMA [facility]

## 2023-12-09 ENCOUNTER — Encounter: Admitting: Certified Nurse Midwife

## 2023-12-22 ENCOUNTER — Ambulatory Visit: Admitting: Physician Assistant

## 2023-12-22 ENCOUNTER — Encounter: Payer: Self-pay | Admitting: Physician Assistant

## 2023-12-22 VITALS — BP 118/87 | HR 77 | Ht 65.0 in | Wt 163.6 lb

## 2023-12-22 DIAGNOSIS — Z720 Tobacco use: Secondary | ICD-10-CM

## 2023-12-22 DIAGNOSIS — E7849 Other hyperlipidemia: Secondary | ICD-10-CM

## 2023-12-22 DIAGNOSIS — H04123 Dry eye syndrome of bilateral lacrimal glands: Secondary | ICD-10-CM

## 2023-12-22 DIAGNOSIS — I10 Essential (primary) hypertension: Secondary | ICD-10-CM

## 2023-12-22 DIAGNOSIS — K6289 Other specified diseases of anus and rectum: Secondary | ICD-10-CM

## 2023-12-22 DIAGNOSIS — R5383 Other fatigue: Secondary | ICD-10-CM

## 2023-12-22 DIAGNOSIS — F339 Major depressive disorder, recurrent, unspecified: Secondary | ICD-10-CM

## 2023-12-22 DIAGNOSIS — E038 Other specified hypothyroidism: Secondary | ICD-10-CM

## 2023-12-22 DIAGNOSIS — K219 Gastro-esophageal reflux disease without esophagitis: Secondary | ICD-10-CM

## 2023-12-22 DIAGNOSIS — Z23 Encounter for immunization: Secondary | ICD-10-CM

## 2023-12-22 DIAGNOSIS — K588 Other irritable bowel syndrome: Secondary | ICD-10-CM

## 2023-12-22 DIAGNOSIS — Z114 Encounter for screening for human immunodeficiency virus [HIV]: Secondary | ICD-10-CM

## 2023-12-22 DIAGNOSIS — K5909 Other constipation: Secondary | ICD-10-CM

## 2023-12-22 DIAGNOSIS — J302 Other seasonal allergic rhinitis: Secondary | ICD-10-CM

## 2023-12-22 DIAGNOSIS — Z1159 Encounter for screening for other viral diseases: Secondary | ICD-10-CM

## 2023-12-22 MED ORDER — CITALOPRAM HYDROBROMIDE 40 MG PO TABS: 40.0000 mg | ORAL_TABLET | Freq: Every day | ORAL | 0 refills | Status: DC

## 2023-12-22 MED ORDER — TOPIRAMATE 25 MG PO TABS: 25.0000 mg | ORAL_TABLET | Freq: Every day | ORAL | 0 refills | Status: DC

## 2023-12-22 MED ORDER — CITALOPRAM HYDROBROMIDE 40 MG PO TABS: 40.0000 mg | ORAL_TABLET | Freq: Every day | ORAL | 1 refills | Status: AC

## 2023-12-22 MED ORDER — RISPERIDONE 1 MG PO TABS: 1.0000 mg | ORAL_TABLET | Freq: Every day | ORAL | 1 refills | Status: AC

## 2023-12-22 MED ORDER — TOPIRAMATE 25 MG PO TABS: 25.0000 mg | ORAL_TABLET | Freq: Every day | ORAL | 1 refills | Status: AC

## 2023-12-22 MED ORDER — RISPERIDONE 1 MG PO TABS: 1.0000 mg | ORAL_TABLET | Freq: Every day | ORAL | 0 refills | Status: DC

## 2023-12-22 NOTE — Progress Notes (Addendum)
 " Established patient visit  Patient: Debra Wang   DOB: 1975-07-05   48 y.o. Female  MRN: [id number] Visit Date: 12/22/2023  Today's healthcare provider: Jolynn Spencer, PA-C   Chief Complaint  Patient presents with   Medical Management of Chronic Issues    3 month f/u   Medication Refill    Citalopram  40mg , Risperidone  1mg , Topiramate  50mg  last time pt saw her psychiatry was 7 months ago due to miscommunication.    Insomnia    Has had trouble sleeping casing her to be moody.    Subjective     HPI     Medical Management of Chronic Issues    Additional comments: 3 month f/u        Medication Refill    Additional comments: Citalopram  40mg , Risperidone  1mg , Topiramate  50mg  last time pt saw her psychiatry was 7 months ago due to miscommunication.         Insomnia    Additional comments: Has had trouble sleeping casing her to be moody.       Last edited by Wilfred Hargis RAMAN, CMA on 12/22/2023  8:45 AM.       Discussed the use of AI scribe software for clinical note transcription with the patient, who gave verbal consent to proceed.  History of Present Illness Debra Wang is a 48 year old female who presents for follow-up of her medical conditions.  She was seen recently for painful rectal bleeding with constipation and was referred to gastroenterology, but outside records are not yet available.  She has hypertension and recent dizziness/positional.  She takes levothyroxine  for hypothyroidism, and her TSH is elevated.  She has hyperlipidemia.  She uses tobacco.  She has irritable bowel syndrome and recurrent depression and anxiety. She takes citalopram , topiramate , and risperidone  for her mental health.  She has gastroesophageal reflux disease and dry eye syndrome.  She has not had a recent Pap smear.       12/22/2023    8:50 AM 09/16/2023    9:31 AM 06/17/2023    1:19 PM  Depression screen PHQ 2/9  Decreased Interest 0 0 0  Down, Depressed, Hopeless  0 0 0  PHQ - 2 Score 0 0 0  Altered sleeping 3 3 0  Tired, decreased energy 3 3 0  Change in appetite 0  0  Feeling bad or failure about yourself  0 0 0  Trouble concentrating 0 0 0  Moving slowly or fidgety/restless 0 0 0  Suicidal thoughts 0 0 0  PHQ-9 Score 6 6  0   Difficult doing work/chores   Not difficult at all     Data saved with a previous flowsheet row definition      12/22/2023    8:50 AM 09/16/2023    9:31 AM 06/17/2023    1:19 PM  GAD 7 : Generalized Anxiety Score  Nervous, Anxious, on Edge 0 0 0  Control/stop worrying 0 0 0  Worry too much - different things 0 0 0  Trouble relaxing 0 0 0  Restless 0 0 0  Easily annoyed or irritable 0 0 0  Afraid - awful might happen 0 0 0  Total GAD 7 Score 0 0 0  Anxiety Difficulty   Not difficult at all    Medications: Outpatient Medications Prior to Visit  Medication Sig   albuterol  (VENTOLIN  HFA) 108 (90 Base) MCG/ACT inhaler Inhale 2 puffs into the lungs every 6 (six) hours as needed for wheezing or shortness  of breath.   budesonide -formoterol  (SYMBICORT ) 160-4.5 MCG/ACT inhaler Inhale 2 puffs into the lungs daily as needed.   cetirizine  (ZYRTEC ) 10 MG tablet Take 1 tablet (10 mg total) by mouth daily.   citalopram  (CELEXA ) 40 MG tablet Take 40 mg by mouth daily.   ezetimibe  (ZETIA ) 10 MG tablet Take 1 tablet (10 mg total) by mouth every evening.   fluticasone  (FLONASE ) 50 MCG/ACT nasal spray Place 1 spray into both nostrils 2 (two) times daily.   ibuprofen  (ADVIL ,MOTRIN ) 400 MG tablet Take 1 tablet (400 mg total) by mouth every 6 (six) hours as needed.   levothyroxine  (SYNTHROID ) 75 MCG tablet Take 1 tablet (75 mcg total) by mouth daily before breakfast.   linaclotide (LINZESS) 72 MCG capsule Take 72 mcg by mouth daily before breakfast.   lisinopril  (ZESTRIL ) 20 MG tablet Take 1 tablet (20 mg total) by mouth daily.   ondansetron  (ZOFRAN  ODT) 4 MG disintegrating tablet Take 1 tablet (4 mg total) by mouth every 8 (eight)  hours as needed for nausea or vomiting.   pantoprazole  (PROTONIX ) 20 MG tablet Take 1 tablet (20 mg total) by mouth daily.   polyethylene glycol powder (GLYCOLAX /MIRALAX ) 17 GM/SCOOP powder Take 17 g by mouth 2 (two) times daily as needed. Dissolve 1 capful (17g) in 4-8 ounces of liquid and take by mouth daily.   risperiDONE  (RISPERDAL ) 1 MG tablet Take 1 mg by mouth at bedtime.   topiramate  (TOPAMAX ) 25 MG tablet Take 25 mg by mouth daily.   traZODone (DESYREL) 50 MG tablet Take 50 mg by mouth at bedtime. prn   No facility-administered medications prior to visit.    Review of Systems All negative Except see HPI       Objective    BP 118/87   Pulse 77   Ht 5' 5 (1.651 m)   Wt 163 lb 9.6 oz (74.2 kg)   SpO2 100%   BMI 27.22 kg/m     Physical Exam   No results found for any visits on 12/22/23.      Assessment & Plan Constipation and rectal bleeding Could be due to hemorrhoids  Has IBS and GERD improved after starting treatment with hemorrhoids  contacted our scheduler.  Was informed that GI clinic will contact patient next week to schedule with new provider Will follow-up  Primary hypertension Chronic recent dizziness. Advised blood pressure monitoring. - Monitor blood pressure and bring readings to next appointment. Advise lifestyle modifications Continue lisinopril  20 mg Will follow-up  Hypothyroidism Chronic and previously unstable :elevated TSH. Increased levothyroxine  to 75 mcg. - Continue levothyroxine  75 mcg. Will check TSH and T4 Will follow-up  Hyperlipidemia Chronic and previously unstable  elevated LDL. ASCVD risk 3.1%. Advise low-cholesterol diet and regular exercise Continue current regimen; Zetia  10 mg Will follow-up  Tobacco use Chronic and stable  continued smoking.  Advised cessation  Will revisit   Depression and anxiety with possible bipolar disorder Chronic and stable  Out of her medications.  Medications will be called in  temporary until patient establish care with psychiatry emphasized psychiatric guidance. - Continue psychiatric follow-up for medication management. Referral to psychiatry placed Referral to integrated care collaboration placed Collaboration of Care: Medication Management AEB  , Primary Care Provider AEB  , Psychiatrist AEB  , and Referral or follow-up with counselor/therapist AEB    Patient/Guardian was advised Release of Information must be obtained prior to any record release in order to collaborate their care with an outside provider. Patient/Guardian was advised if  they have not already done so to contact the registration department to sign all necessary forms in order for us  to release information regarding their care.   Consent: Patient and/or legal guardian verbally consented to [facility] services about presenting concerns and psychiatric consultation as appropriate. The services will be billed as appropriate for the patient    Dry eye syndrome  General Health Maintenance Discussed routine screenings and vaccinations. Declined COVID vaccine. - Referral to OB/GYN was placed in August 2025 Advised to contact OB/GYN clinic and schedule appointment Needs updated Pap smear and mammogram Will check ANA if dry eyes persist    Primary hypertension  - TSH + free T4 - VITAMIN D  25 Hydroxy (Vit-D Deficiency, Fractures) - Comprehensive metabolic panel with GFR - CBC with Differential/Platelet - Lipid panel - Hemoglobin A1c  Other specified hypothyroidism  - TSH + free T4  Other hyperlipidemia  - TSH + free T4 - VITAMIN D  25 Hydroxy (Vit-D Deficiency, Fractures) - Comprehensive metabolic panel with GFR - CBC with Differential/Platelet - Lipid panel - Hemoglobin A1c   Depression, recurrent  - Ambulatory referral to Psychiatry - Amb ref to Integrated Behavioral Health - citalopram  (CELEXA ) 40 MG tablet; Take 1 tablet (40 mg total) by mouth daily.   Dispense: 30 tablet; Refill: 0 - risperiDONE  (RISPERDAL ) 1 MG tablet; Take 1 tablet (1 mg total) by mouth at bedtime.  Dispense: 30 tablet; Refill: 0 - topiramate  (TOPAMAX ) 25 MG tablet; Take 1 tablet (25 mg total) by mouth daily.  Dispense: 30 tablet; Refill: 0  Immunization due - Flu vaccine trivalent PF, 6mos and older(Flulaval,Afluria,Fluarix,Fluzone)  Other fatigue  - TSH + free T4 - VITAMIN D  25 Hydroxy (Vit-D Deficiency, Fractures) - Comprehensive metabolic panel with GFR - CBC with Differential/Platelet - Lipid panel - Hemoglobin A1c  Need for hepatitis C screening test - Hepatitis C antibody  Encounter for screening for HIV - HIV Antibody (routine testing w rflx)   Orders Placed This Encounter  Procedures   Flu vaccine trivalent PF, 6mos and older(Flulaval,Afluria,Fluarix,Fluzone)    No follow-ups on file.   The patient was advised to call back or seek an in-person evaluation if the symptoms worsen or if the condition fails to improve as anticipated.  I discussed the assessment and treatment plan with the patient. The patient was provided an opportunity to ask questions and all were answered. The patient agreed with the plan and demonstrated an understanding of the instructions.  I, Jerney Baksh, PA-C have reviewed all documentation for this visit. The documentation on 12/22/2023  for the exam, diagnosis, procedures, and orders are all accurate and complete.  Jolynn Spencer, [facility], MMS [facility] [phone] (phone) [phone] (fax)  [facility] "

## 2023-12-23 LAB — CBC WITH DIFFERENTIAL/PLATELET
Basophils Absolute: 0.1 x10E3/uL (ref 0.0–0.2)
Basos: 1 %
EOS (ABSOLUTE): 0.3 x10E3/uL (ref 0.0–0.4)
Eos: 3 %
Hematocrit: 45.6 % (ref 34.0–46.6)
Hemoglobin: 15.1 g/dL (ref 11.1–15.9)
Immature Grans (Abs): 0.1 x10E3/uL (ref 0.0–0.1)
Immature Granulocytes: 1 %
Lymphocytes Absolute: 1.7 x10E3/uL (ref 0.7–3.1)
Lymphs: 16 %
MCH: 30.8 pg (ref 26.6–33.0)
MCHC: 33.1 g/dL (ref 31.5–35.7)
MCV: 93 fL (ref 79–97)
Monocytes Absolute: 0.8 x10E3/uL (ref 0.1–0.9)
Monocytes: 7 %
Neutrophils Absolute: 7.8 x10E3/uL — ABNORMAL HIGH (ref 1.4–7.0)
Neutrophils: 72 %
Platelets: 202 x10E3/uL (ref 150–450)
RBC: 4.91 x10E6/uL (ref 3.77–5.28)
RDW: 12.4 % (ref 11.7–15.4)
WBC: 10.8 x10E3/uL (ref 3.4–10.8)

## 2023-12-23 LAB — HEPATITIS C ANTIBODY: Hep C Virus Ab: NONREACTIVE

## 2023-12-23 LAB — COMPREHENSIVE METABOLIC PANEL WITH GFR
ALT: 9 IU/L (ref 0–32)
AST: 15 IU/L (ref 0–40)
Albumin: 4.2 g/dL (ref 3.9–4.9)
Alkaline Phosphatase: 64 IU/L (ref 41–116)
BUN/Creatinine Ratio: 20 (ref 9–23)
BUN: 14 mg/dL (ref 6–24)
Bilirubin Total: <0.2 mg/dL (ref 0.0–1.2)
CO2: 21 mmol/L (ref 20–29)
Calcium: 9.7 mg/dL (ref 8.7–10.2)
Chloride: 105 mmol/L (ref 96–106)
Creatinine, Ser: 0.71 mg/dL (ref 0.57–1.00)
Globulin, Total: 2 g/dL (ref 1.5–4.5)
Glucose: 92 mg/dL (ref 70–99)
Potassium: 4.5 mmol/L (ref 3.5–5.2)
Sodium: 140 mmol/L (ref 134–144)
Total Protein: 6.2 g/dL (ref 6.0–8.5)
eGFR: 105 mL/min/1.73 (ref >59)

## 2023-12-23 LAB — LIPID PANEL
Chol/HDL Ratio: 3.6 ratio (ref 0.0–4.4)
Cholesterol, Total: 171 mg/dL (ref 100–199)
HDL: 47 mg/dL (ref >39)
LDL Chol Calc (NIH): 109 mg/dL — ABNORMAL HIGH (ref 0–99)
Triglycerides: 80 mg/dL (ref 0–149)
VLDL Cholesterol Cal: 15 mg/dL (ref 5–40)

## 2023-12-23 LAB — TSH+FREE T4
Free T4: 1.22 ng/dL (ref 0.82–1.77)
TSH: 3.66 u[IU]/mL (ref 0.450–4.500)

## 2023-12-23 LAB — VITAMIN D 25 HYDROXY (VIT D DEFICIENCY, FRACTURES): Vit D, 25-Hydroxy: 81.5 ng/mL (ref 30.0–100.0)

## 2023-12-23 LAB — HIV ANTIBODY (ROUTINE TESTING W REFLEX): HIV Screen 4th Generation wRfx: NONREACTIVE

## 2023-12-23 LAB — HEMOGLOBIN A1C
Est. average glucose Bld gHb Est-mCnc: 100 mg/dL
Hgb A1c MFr Bld: 5.1 % (ref 4.8–5.6)

## 2023-12-26 ENCOUNTER — Ambulatory Visit: Payer: Self-pay | Admitting: Physician Assistant

## 2023-12-26 NOTE — Progress Notes (Signed)
 All labs are stable except elevated cholesterol levels with low risk for heart attack and stroke Of 3.6%/advise low-cholesterol diet and regular exercise as tolerated

## 2024-01-20 ENCOUNTER — Ambulatory Visit (INDEPENDENT_AMBULATORY_CARE_PROVIDER_SITE_OTHER): Admitting: Gastroenterology

## 2024-01-20 VITALS — BP 112/79 | HR 93 | Temp 98.3°F | Ht 65.5 in | Wt 158.0 lb

## 2024-01-20 DIAGNOSIS — K921 Melena: Secondary | ICD-10-CM

## 2024-01-20 DIAGNOSIS — Z1211 Encounter for screening for malignant neoplasm of colon: Secondary | ICD-10-CM

## 2024-01-20 DIAGNOSIS — K5900 Constipation, unspecified: Secondary | ICD-10-CM

## 2024-01-20 DIAGNOSIS — K649 Unspecified hemorrhoids: Secondary | ICD-10-CM

## 2024-01-20 DIAGNOSIS — R195 Other fecal abnormalities: Secondary | ICD-10-CM | POA: Diagnosis not present

## 2024-01-20 DIAGNOSIS — K641 Second degree hemorrhoids: Secondary | ICD-10-CM

## 2024-01-20 MED ORDER — NA SULFATE-K SULFATE-MG SULF 17.5-3.13-1.6 GM/177ML PO SOLN: 1.0000 | Freq: Once | ORAL | 0 refills | Status: AC

## 2024-01-20 NOTE — Addendum Note (Signed)
 Addended by: LANNIE ANDREA GRADE on: 01/20/2024 11:41 AM   Modules accepted: Orders

## 2024-01-20 NOTE — Progress Notes (Signed)
 "   Gastroenterology Consultation  Referring Provider:     Ostwalt, Janna, PA-C Primary Care Physician:  Ostwalt, Janna, PA-C Primary Gastroenterologist:  Dr. Melany     Reason for Consultation:     Blood in stool        HPI:   Debra Wang is a 48 y.o. y/o female referred for consultation & management of blood in stool by Dr. Dineen, Janna, PA-C.  She was very constipated, by her own admission does not drink enough water, and developed blood in her stool that lasted for 2 days.  Shortly afterward she developed painful hemorrhoids, explaining the source of blood.  She is 48 with no family history of colon cancer.  She has never had colonoscopy because she has been afraid.  Past Medical History:  Diagnosis Date   Anxiety    Anxiety    Depression    Depression    Hemorrhoid    UTI (lower urinary tract infection)    UTI (lower urinary tract infection)     Past Surgical History:  Procedure Laterality Date   TUBAL LIGATION  2009   TUBAL LIGATION      Prior to Admission medications  Medication Sig Start Date End Date Taking? Authorizing Provider  albuterol  (VENTOLIN  HFA) 108 (90 Base) MCG/ACT inhaler Inhale 2 puffs into the lungs every 6 (six) hours as needed for wheezing or shortness of breath. 09/16/23  Yes Ostwalt, Janna, PA-C  budesonide -formoterol  (SYMBICORT ) 160-4.5 MCG/ACT inhaler Inhale 2 puffs into the lungs daily as needed. 09/17/23  Yes Ostwalt, Janna, PA-C  cetirizine  (ZYRTEC ) 10 MG tablet Take 1 tablet (10 mg total) by mouth daily. 06/17/23  Yes Ostwalt, Janna, PA-C  citalopram  (CELEXA ) 40 MG tablet Take 1 tablet (40 mg total) by mouth daily. 12/22/23  Yes Ostwalt, Janna, PA-C  ezetimibe  (ZETIA ) 10 MG tablet Take 1 tablet (10 mg total) by mouth every evening. 06/17/23  Yes Ostwalt, Janna, PA-C  fluticasone  (FLONASE ) 50 MCG/ACT nasal spray Place 1 spray into both nostrils 2 (two) times daily. 09/16/23  Yes Ostwalt, Janna, PA-C  ibuprofen  (ADVIL ,MOTRIN ) 400 MG tablet Take 1  tablet (400 mg total) by mouth every 6 (six) hours as needed. 12/15/15  Yes Brain Redell RAMAN, MD  levothyroxine  (SYNTHROID ) 75 MCG tablet Take 1 tablet (75 mcg total) by mouth daily before breakfast. 09/17/23  Yes Ostwalt, Janna, PA-C  linaclotide (LINZESS) 72 MCG capsule Take 72 mcg by mouth daily before breakfast. 03/16/19  Yes [provider]  lisinopril  (ZESTRIL ) 20 MG tablet Take 1 tablet (20 mg total) by mouth daily. 06/17/23  Yes Ostwalt, Janna, PA-C  ondansetron  (ZOFRAN  ODT) 4 MG disintegrating tablet Take 1 tablet (4 mg total) by mouth every 8 (eight) hours as needed for nausea or vomiting. 02/22/17  Yes Dorothyann Drivers, MD  pantoprazole  (PROTONIX ) 20 MG tablet Take 1 tablet (20 mg total) by mouth daily. 06/17/23 06/16/24 Yes Ostwalt, Janna, PA-C  polyethylene glycol powder (GLYCOLAX /MIRALAX ) 17 GM/SCOOP powder Take 17 g by mouth 2 (two) times daily as needed. Dissolve 1 capful (17g) in 4-8 ounces of liquid and take by mouth daily. 12/01/23  Yes Ostwalt, Janna, PA-C  risperiDONE  (RISPERDAL ) 1 MG tablet Take 1 tablet (1 mg total) by mouth at bedtime. 12/22/23  Yes Ostwalt, Janna, PA-C  topiramate  (TOPAMAX ) 25 MG tablet Take 1 tablet (25 mg total) by mouth daily. 12/22/23  Yes Ostwalt, Janna, PA-C    Family History  Problem Relation Age of Onset   Diabetes Mother  Colon cancer Father    Breast cancer Other    Ovarian cancer Other      Social History[1]  Allergies as of 01/20/2024 - Review Complete 01/20/2024  Allergen Reaction Noted   Benadryl [diphenhydramine hcl (sleep)]  06/18/2015   Diphenhydramine Other (See Comments) 08/02/2015   Elemental sulfur Itching 08/02/2015   Sulfa antibiotics Nausea And Vomiting and Rash 06/10/2014    Review of Systems:    All systems reviewed and negative except where noted in HPI.   Physical Exam:  BP 112/79 (BP Location: Right Arm, Patient Position: Sitting, Cuff Size: Normal)   Pulse 93   Temp 98.3 F (36.8 C) (Oral)   Ht 5' 5.5  (1.664 m)   Wt 158 lb (71.7 kg)   BMI 25.89 kg/m  No LMP recorded. (Menstrual status: IUD). General:   Alert,  Well-developed,  well-nourished, pleasant and cooperative in NAD Head:  Normocephalic and atraumatic. Eyes:  Sclera clear, no icterus.   Conjunctiva pink. Ears:  Normal auditory acuity. Neck:  Supple; no masses or thyromegaly. Lungs:  Respirations even and unlabored.  Clear throughout to auscultation.   No wheezes, crackles, or rhonchi. No acute distress. Heart:  Regular rate and rhythm; no murmurs, clicks, rubs, or gallops. Abdomen:  Normal bowel sounds.  No bruits.  Soft, non-tender and non-distended without masses, hepatosplenomegaly or hernias noted.  No guarding or rebound tenderness.   Negative Carnett sign.   Rectal:  Deferred.  Pulses:  Normal pulses noted. Extremities:  No clubbing or edema.  No cyanosis. Neurologic:  Alert and oriented x3;  grossly normal neurologically. Skin:  Intact without significant lesions or rashes.   No jaundice. Lymph Nodes:  No significant cervical adenopathy. Psych:  Alert and cooperative. Normal mood and affect.  Imaging Studies: No results found.  Assessment and Plan:   Debra Wang is a 48 y.o. y/o female who is past due for screening colonoscopy.  She is average risk, there is no family history of colon cancer.  She has a history of constipation and brief period of blood in her stool, attributable to hemorrhoids.  I recommended that she begin psyllium husk capsules, 3/day for a week, then 6/day for a week, then 9 to 10 capsules daily, indefinitely.  She should drink 2 large glasses of water with the fiber pills.  She admits that she needs to increase hydration in general.  [name], MD   Note: This dictation was prepared with Dragon dictation along with smaller phrase technology. Any transcriptional errors that result from this process are unintentional.       [1]  Social History Tobacco Use   Smoking status: Every  Day    Current packs/day: 0.50    Types: Cigarettes   Smokeless tobacco: Never  Vaping Use   Vaping status: Never Used  Substance Use Topics   Alcohol use: No   Drug use: No   "

## 2024-01-26 ENCOUNTER — Ambulatory Visit (INDEPENDENT_AMBULATORY_CARE_PROVIDER_SITE_OTHER): Admitting: Licensed Clinical Social Worker

## 2024-01-26 DIAGNOSIS — F411 Generalized anxiety disorder: Secondary | ICD-10-CM

## 2024-01-26 DIAGNOSIS — F330 Major depressive disorder, recurrent, mild: Secondary | ICD-10-CM
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
## 2024-01-26 NOTE — Patient Instructions (Signed)
 Sleep Hygiene Tips  Bedroom Environment - Keep your bedroom cool, quiet, and dark - Use blackout curtains or a sleep mask - Limit noise with earplugs or a white noise machine - Invest in a comfortable mattress and pillows - Remove electronic devices or dim brightness  Sleep Schedule - Go to bed and wake up at the same time every day, even on weekends - Avoid long naps during the day (limit to 2030 minutes if needed) - Establish a relaxing pre-sleep routine (e.g., reading, gentle stretching)  Diet & Substances - Avoid caffeine, nicotine, and alcohol close to bedtime - Don't go to bed hungry or overly full - Limit fluid intake in the evening to reduce nighttime bathroom trips  Technology & Stimulation - Avoid screens (phones, tablets, TVs) at least 30-60 minutes before bed - Use blue light filters if screen use is unavoidable - Don't work or watch intense/triggering content in bed  Mental & Physical Health - Practice relaxation techniques like deep breathing, meditation, or progressive muscle relaxation - Exercise regularly, but not too close to bedtime - Manage stress and anxiety through journaling, therapy, or mindfulness  When You Cant Sleep - Get out of bed if you can't sleep after 20 minutes - Do something calming in dim light, then return to bed when sleepy - Avoid clock-watching, which can increase anxiety

## 2024-01-27 ENCOUNTER — Encounter: Payer: Self-pay | Admitting: Gastroenterology

## 2024-01-28 ENCOUNTER — Telehealth (INDEPENDENT_AMBULATORY_CARE_PROVIDER_SITE_OTHER): Payer: Self-pay | Admitting: Licensed Clinical Social Worker

## 2024-01-28 DIAGNOSIS — F330 Major depressive disorder, recurrent, mild: Secondary | ICD-10-CM

## 2024-01-28 DIAGNOSIS — F411 Generalized anxiety disorder: Secondary | ICD-10-CM

## 2024-01-28 NOTE — BH Specialist Note (Signed)
 "   Attestation signed by Jacquetta Sharlot GRADE, NP at 01/28/2024 10:42 AM   Attestation signed by Warren Jacquetta, PMHNP, DNP 01/28/2024 10:24 AM   Collaborative Care Psychiatric Consultant Case Review   Assessment/Provisional Diagnosis 49 year old female with history of tobacco use, HTN, hypothyroidism, IBS, hyperlipidemia, GERD, chronic back pain, and seasonal allergies. The patient is referred for anxiety and depression.   Provisional Diagnosis: # Major depressive disorder, recurrent, mild # GAD   Recommendation 1. Continue Celexa  40 mg daily, Risperdal  1 mg at bedtime, and Topamax  25 mg daily as the client feels it is managing her symptoms 2. Recommend a vitamin D  level 3. BH specialist to follow up.   Thank you for your consult. Please contact our collaborative care team for any questions or concerns.   The above treatment considerations and suggestions are based on consultation with the [facility] specialist and/or PCP and a review of information available in the shared registry and the patient's Electronic Health Record (EHR). I have not personally examined the patient. All recommendations should be implemented with consideration of the patient's relevant prior history and current clinical status. Please feel free to call me with any questions about the care of this patient.            Virtual Behavioral Health Treatment Plan Team Note  MRN: [id number] NAME: Debra Wang  DATE: 01/28/2024  Start time: Start Time: 0900 End time: Stop Time: 0920 Total time: Total Time in Minutes (Visit): 20  Total number of Virtual BH Treatment Team Plan encounters: 1/4  Treatment Team Attendees: Daxtin Leiker, LCSW and Sharlot Jacquetta, DNP   Diagnoses:    ICD-10-CM   1. MDD (major depressive disorder), recurrent episode, mild  F33.0     2. Generalized anxiety disorder  F41.1       Goals, Interventions and Follow-up Plan Goals: Increase healthy adjustment to current life  circumstances Interventions: Medication Monitoring Supportive Counseling Sleep Hygiene  Medication Management Recommendations: No current changes to medication recommended at time of treatment planning  Follow-up Plan: Refer to Psychiatrist for Medication Management [facility] Collaborative Care team while waiting for pt to establish with new psychiatric med management provider  History of the present illness Presenting Problem/Current Symptoms: Debra Wang is a 49 y.o. y.o. adult patient with history of depression, anxiety, and insomnia seen in consultation at the request of Janna ostwalt PA C for establishment of [facility] Collaborative Care management. Pt was previously under the care of [facility] in [place].  Pt is currently taking the following psychiatric medications: citalopram  40 mg, risperdal  1mg , topamax  25 mg. Pt reports that she has taken hydroxyzine 25 and trazodone 50 unsuccessfully in the past . Current symptoms include: insomnia most nights (hard to fall asleep, hard to stay asleep), feeling low energy, irritability at times, worrying on some days..  Pt denies SI, HI, or AVH at time of session. Pt denies substance use (smokes cigarettes daily).  History of present illness: Debra Wang reports that they have a history of depression, anxiety, and insomnia for 14 years--triggered by the birth of her son and have had the following treatments: inpatient hospitalization 14 years ago, psychiatric medication management ([facility] in [place]. Pt reports that she had some issues/isolated incident about an appointment, and her psychiatric provider would not continue her medication but make her start over as a new patient and would have to wait to be re-appointed back into the practice.  Pt reports concerns about  medical history including thyroid concerns, GI issues, hypertension .  Pt reports that current external stressors include her family and worries  about her adult sons. Pt feels that symptoms of stress, depression, and anxiety are not impacting impacting everyday functioning  except sleep quality/quantity. Debra Wang reports that her family members are primary support system at time of assessment.    Psychiatric History    Have you ever been treated for a mental health problem? Yes If Yes, when were you treated and whom did you see (psychiatrist/counselor) ?        When: since age 48              Name of provider: psychiatrist in [place]    Depression: Yes Anxiety: Yes Mania: No Psychosis: No PTSD symptoms: No   Past Psychiatric History/Hospitalization(s): Hospitalization for psychiatric illness: Yes  PPD 14 years ago Prior Self-injurious behavior: No   Have you ever had thoughts of harming yourself or others or attempted suicide? No plan to harm self or others  Psychosocial stressors Flowsheet Row Integrated Behavioral Health from 01/26/2024 in [facility]  Current Stressors Family conflict  Familial Stressors None  Sleep Difficulty falling asleep, Decreased, Difficulty staying asleep, Frequent awakening  Appetite No problems  Coping ability Normal  Patient taking medications as prescribed Yes    Self-harm Behaviors Risk Assessment Flowsheet Row Integrated Behavioral Health from 01/26/2024 in [facility]  Self-harm risk factors --  [none]  Have you recently had any thoughts about harming yourself? No    Screenings PHQ-9 Assessments:     01/26/2024   11:54 AM 12/22/2023    8:50 AM 09/16/2023    9:31 AM  Depression screen PHQ 2/9  Decreased Interest 2 0 0  Down, Depressed, Hopeless 0 0 0  PHQ - 2 Score 2 0 0  Altered sleeping 3 3 3   Tired, decreased energy 3 3 3   Change in appetite 0 0   Feeling bad or failure about yourself  0 0 0  Trouble concentrating 0 0 0  Moving slowly or fidgety/restless 0 0 0  Suicidal thoughts 0 0 0  PHQ-9  Score 8 6 6    Difficult doing work/chores Very difficult       Data saved with a previous flowsheet row definition   GAD-7 Assessments:     01/26/2024   11:55 AM 12/22/2023    8:50 AM 09/16/2023    9:31 AM 06/17/2023    1:19 PM  GAD 7 : Generalized Anxiety Score  Nervous, Anxious, on Edge 0 0 0 0  Control/stop worrying 0 0 0 0  Worry too much - different things 1 0 0 0  Trouble relaxing 1 0 0 0  Restless 0 0 0 0  Easily annoyed or irritable 2 0 0 0  Afraid - awful might happen 0 0 0 0  Total GAD 7 Score 4 0 0 0  Anxiety Difficulty Somewhat difficult   Not difficult at all    Past Medical History Past Medical History:  Diagnosis Date   Anxiety    Anxiety    Depression    Depression    Hemorrhoid    UTI (lower urinary tract infection)    UTI (lower urinary tract infection)     Vital signs: There were no vitals filed for this visit.  Allergies:  Allergies as of 01/28/2024 - Review Complete 01/27/2024  Allergen Reaction Noted   Benadryl [diphenhydramine hcl (  sleep)]  06/18/2015   Diphenhydramine Other (See Comments) 08/02/2015   Elemental sulfur Itching 08/02/2015   Sulfa antibiotics Nausea And Vomiting and Rash 06/10/2014    Medication History Current medications:  Outpatient Encounter Medications as of 01/28/2024  Medication Sig   albuterol  (VENTOLIN  HFA) 108 (90 Base) MCG/ACT inhaler Inhale 2 puffs into the lungs every 6 (six) hours as needed for wheezing or shortness of breath.   budesonide -formoterol  (SYMBICORT ) 160-4.5 MCG/ACT inhaler Inhale 2 puffs into the lungs daily as needed.   cetirizine  (ZYRTEC ) 10 MG tablet Take 1 tablet (10 mg total) by mouth daily.   citalopram  (CELEXA ) 40 MG tablet Take 1 tablet (40 mg total) by mouth daily.   ezetimibe  (ZETIA ) 10 MG tablet Take 1 tablet (10 mg total) by mouth every evening.   fluticasone  (FLONASE ) 50 MCG/ACT nasal spray Place 1 spray into both nostrils 2 (two) times daily.   ibuprofen  (ADVIL ,MOTRIN ) 400 MG tablet Take 1  tablet (400 mg total) by mouth every 6 (six) hours as needed.   levothyroxine  (SYNTHROID ) 75 MCG tablet Take 1 tablet (75 mcg total) by mouth daily before breakfast.   linaclotide (LINZESS) 72 MCG capsule Take 72 mcg by mouth daily before breakfast.   lisinopril  (ZESTRIL ) 20 MG tablet Take 1 tablet (20 mg total) by mouth daily. (Patient not taking: Reported on 01/27/2024)   ondansetron  (ZOFRAN  ODT) 4 MG disintegrating tablet Take 1 tablet (4 mg total) by mouth every 8 (eight) hours as needed for nausea or vomiting.   pantoprazole  (PROTONIX ) 20 MG tablet Take 1 tablet (20 mg total) by mouth daily.   polyethylene glycol powder (GLYCOLAX /MIRALAX ) 17 GM/SCOOP powder Take 17 g by mouth 2 (two) times daily as needed. Dissolve 1 capful (17g) in 4-8 ounces of liquid and take by mouth daily.   risperiDONE  (RISPERDAL ) 1 MG tablet Take 1 tablet (1 mg total) by mouth at bedtime.   topiramate  (TOPAMAX ) 25 MG tablet Take 1 tablet (25 mg total) by mouth daily.   No facility-administered encounter medications on file as of 01/28/2024.     Scribe for Treatment Team: Poppy Mcafee R Camiya Vinal, LCSW "

## 2024-02-05 ENCOUNTER — Encounter: Payer: Self-pay | Admitting: Physician Assistant

## 2024-02-05 ENCOUNTER — Ambulatory Visit: Admitting: Physician Assistant

## 2024-02-05 VITALS — BP 100/77 | HR 84 | Temp 97.9°F | Ht 65.5 in | Wt 163.0 lb

## 2024-02-05 DIAGNOSIS — Z720 Tobacco use: Secondary | ICD-10-CM | POA: Diagnosis not present

## 2024-02-05 DIAGNOSIS — Z23 Encounter for immunization: Secondary | ICD-10-CM | POA: Diagnosis not present

## 2024-02-05 DIAGNOSIS — Z1211 Encounter for screening for malignant neoplasm of colon: Secondary | ICD-10-CM

## 2024-02-05 DIAGNOSIS — Z124 Encounter for screening for malignant neoplasm of cervix: Secondary | ICD-10-CM

## 2024-02-05 DIAGNOSIS — K219 Gastro-esophageal reflux disease without esophagitis: Secondary | ICD-10-CM

## 2024-02-05 DIAGNOSIS — Z1231 Encounter for screening mammogram for malignant neoplasm of breast: Secondary | ICD-10-CM

## 2024-02-05 DIAGNOSIS — E038 Other specified hypothyroidism: Secondary | ICD-10-CM

## 2024-02-05 DIAGNOSIS — I10 Essential (primary) hypertension: Secondary | ICD-10-CM | POA: Diagnosis not present

## 2024-02-05 DIAGNOSIS — F339 Major depressive disorder, recurrent, unspecified: Secondary | ICD-10-CM

## 2024-02-05 DIAGNOSIS — E7849 Other hyperlipidemia: Secondary | ICD-10-CM

## 2024-02-05 NOTE — Progress Notes (Signed)
 " Established patient visit  Patient: Debra Wang   DOB: 1975/12/18   49 y.o. Female  MRN: [id number] Visit Date: 02/05/2024  Today's healthcare provider: Jolynn Spencer, PA-C   Chief Complaint  Patient presents with   Hypertension    Patient was having side effects of cramping while on Lisinopril  and having low blood pressure readings.  She has since come off the Lisinopril    Depression    Patient states her depression is about the same as last visit.  Patient is currently on Citalopram  and Resperdal.  Patient states she is not sleeping well.  She is having difficulty falling asleep due to not stopping her thoughts.  Once she is asleep she states she sleeps ok.  She has used Melatonin 10 mg in the past but it did not help   Medical Management of Chronic Issues    Patient is due for colon cancer screening and pap smear.  She has her colonoscopy scheduled on 02/23/24. She is going to schedule her physical for May and have her pap at that time.  Patient does want to go ahead with her Prevnar and Tdap today.   Subjective     HPI     Hypertension    Additional comments: Patient was having side effects of cramping while on Lisinopril  and having low blood pressure readings.  She has since come off the Lisinopril         Depression    Additional comments: Patient states her depression is about the same as last visit.  Patient is currently on Citalopram  and Resperdal.  Patient states she is not sleeping well.  She is having difficulty falling asleep due to not stopping her thoughts.  Once she is asleep she states she sleeps ok.  She has used Melatonin 10 mg in the past but it did not help        Medical Management of Chronic Issues    Additional comments: Patient is due for colon cancer screening and pap smear.  She has her colonoscopy scheduled on 02/23/24. She is going to schedule her physical for May and have her pap at that time.  Patient does want to go ahead with her Prevnar and Tdap  today.      Last edited by Desanto, Elena D, CMA on 02/05/2024  8:17 AM.       Discussed the use of AI scribe software for clinical note transcription with the patient, who gave verbal consent to proceed.  History of Present Illness Debra Wang is a 49 year old female with depression who presents for medication management and follow-up.  She has persistent depression and poor sleep despite Lexapro, Risperdal , and topiramate . She has been on Risperdal  for about 8 to 10 months, prescribed by [facility], but was discharged from her care after a miscommunication.  She stopped lisinopril  and is not taking any medication for hypertension.  She continues to smoke. She has not eaten today but has been drinking fluids.       02/05/2024    8:14 AM 01/26/2024   11:54 AM 12/22/2023    8:50 AM  Depression screen PHQ 2/9  Decreased Interest 3 2 0  Down, Depressed, Hopeless 0 0 0  PHQ - 2 Score 3 2 0  Altered sleeping 3 3 3   Tired, decreased energy 3 3 3   Change in appetite 0 0 0  Feeling bad or failure about yourself  0 0 0  Trouble concentrating 0 0 0  Moving slowly or fidgety/restless 0 0 0  Suicidal thoughts 0 0 0  PHQ-9 Score 9 8 6   Difficult doing work/chores Very difficult Very difficult       02/05/2024    8:14 AM 01/26/2024   11:55 AM 12/22/2023    8:50 AM 09/16/2023    9:31 AM  GAD 7 : Generalized Anxiety Score  Nervous, Anxious, on Edge 0 0 0 0  Control/stop worrying 0 0 0 0  Worry too much - different things 0 1 0 0  Trouble relaxing 1 1 0 0  Restless 0 0 0 0  Easily annoyed or irritable 1 2 0 0  Afraid - awful might happen 0 0 0 0  Total GAD 7 Score 2 4 0 0  Anxiety Difficulty Very difficult Somewhat difficult      Medications: Show/hide medication list[1]  Review of Systems All negative Except see HPI       Objective    BP 100/77 (BP Location: Left Arm, Patient Position: Sitting, Cuff Size: Normal)   Pulse 84   Temp 97.9 F (36.6 C)  (Oral)   Ht 5' 5.5 (1.664 m)   Wt 163 lb (73.9 kg)   SpO2 99%   BMI 26.71 kg/m     Physical Exam Vitals reviewed.  Constitutional:      General: She is not in acute distress.    Appearance: Normal appearance. She is well-developed. She is not diaphoretic.  HENT:     Head: Normocephalic and atraumatic.  Eyes:     General: No scleral icterus.    Conjunctiva/sclera: Conjunctivae normal.  Neck:     Thyroid: No thyromegaly.  Cardiovascular:     Rate and Rhythm: Normal rate and regular rhythm.     Pulses: Normal pulses.     Heart sounds: Normal heart sounds. No murmur heard. Pulmonary:     Effort: Pulmonary effort is normal. No respiratory distress.     Breath sounds: Normal breath sounds. No wheezing, rhonchi or rales.  Musculoskeletal:     Cervical back: Neck supple.     Right lower leg: No edema.     Left lower leg: No edema.  Lymphadenopathy:     Cervical: No cervical adenopathy.  Skin:    General: Skin is warm and dry.     Findings: No rash.  Neurological:     Mental Status: She is alert and oriented to person, place, and time. Mental status is at baseline.  Psychiatric:        Mood and Affect: Mood normal.        Behavior: Behavior normal.      No results found for any visits on 02/05/24.      Assessment & Plan Major depressive disorder, recurrent Recurrent depression with persistent sleep disturbances. Suspected inadequate response to Lexapro, Risperdal , and Topiramate . - Referral to psychiatry for medication evaluation and adjustment was placed on 12/22/23. - Advised to contact [facility] for crisis intervention if needed. - Continue current medications until psychiatric evaluation. Will follow-up  Primary hypertension Chronic and stable Blood pressure well-controlled without medication after discontinuing lisinopril . - Continue regular home blood pressure monitoring. - Follow up with blood pressure readings at each visit. Will follow-up  General health  maintenance Routine health maintenance discussed. Smoking cessation discussed as a future goal. - Continue routine health maintenance.  Other specified hypothyroidism Chronic and stable TSH from 12/22/2023 was normal Continue levothyroxine  75 mcg Will follow-up  Other hyperlipidemia Chronic and previously unstable LDL cholesterol slightly  elevated Advise low-cholesterol diet and regular exercise Will follow-up  Need for pneumococcal 20-valent conjugate vaccination - Pneumococcal conjugate vaccine 20-valent (Prevnar 20)  Need for tetanus booster - Tdap vaccine greater than or equal to 7yo IM   Encounter for screening mammogram for malignant neoplasm of breast Pt needs to contact [facility] and schedule an appointment - MM 3D SCREENING MAMMOGRAM BILATERAL BREAST; Future  Tobacco use Cessation advised Will revisit  Orders Placed This Encounter  Procedures   MM 3D SCREENING MAMMOGRAM BILATERAL BREAST    Standing Status:   Future    Expiration Date:   04/02/2025    Reason for Exam (SYMPTOM  OR DIAGNOSIS REQUIRED):   screen    Preferred [facility]?:    [facility]    Is the patient pregnant?:   No   Pneumococcal conjugate vaccine 20-valent (Prevnar 20)   Tdap vaccine greater than or equal to 7yo IM    No follow-ups on file.   The patient was advised to call back or seek an in-person evaluation if the symptoms worsen or if the condition fails to improve as anticipated.  I discussed the assessment and treatment plan with the patient. The patient was provided an opportunity to ask questions and all were answered. The patient agreed with the plan and demonstrated an understanding of the instructions.  I, Yehudit Fulginiti, PA-C have reviewed all documentation for this visit. The documentation on 02/05/2024  for the exam, diagnosis, procedures, and orders are all accurate and complete.  Jolynn Spencer, [facility], MMS [facility] [phone] (phone) [phone]  (fax)  [facility]     [1]  Outpatient Medications Prior to Visit  Medication Sig   albuterol  (VENTOLIN  HFA) 108 (90 Base) MCG/ACT inhaler Inhale 2 puffs into the lungs every 6 (six) hours as needed for wheezing or shortness of breath.   budesonide -formoterol  (SYMBICORT ) 160-4.5 MCG/ACT inhaler Inhale 2 puffs into the lungs daily as needed.   cetirizine  (ZYRTEC ) 10 MG tablet Take 1 tablet (10 mg total) by mouth daily.   citalopram  (CELEXA ) 40 MG tablet Take 1 tablet (40 mg total) by mouth daily.   ezetimibe  (ZETIA ) 10 MG tablet Take 1 tablet (10 mg total) by mouth every evening.   fluticasone  (FLONASE ) 50 MCG/ACT nasal spray Place 1 spray into both nostrils 2 (two) times daily.   ibuprofen  (ADVIL ,MOTRIN ) 400 MG tablet Take 1 tablet (400 mg total) by mouth every 6 (six) hours as needed.   levothyroxine  (SYNTHROID ) 75 MCG tablet Take 1 tablet (75 mcg total) by mouth daily before breakfast.   linaclotide (LINZESS) 72 MCG capsule Take 72 mcg by mouth daily before breakfast.   lisinopril  (ZESTRIL ) 20 MG tablet Take 1 tablet (20 mg total) by mouth daily.   pantoprazole  (PROTONIX ) 20 MG tablet Take 1 tablet (20 mg total) by mouth daily.   polyethylene glycol powder (GLYCOLAX /MIRALAX ) 17 GM/SCOOP powder Take 17 g by mouth 2 (two) times daily as needed. Dissolve 1 capful (17g) in 4-8 ounces of liquid and take by mouth daily.   risperiDONE  (RISPERDAL ) 1 MG tablet Take 1 tablet (1 mg total) by mouth at bedtime.   topiramate  (TOPAMAX ) 25 MG tablet Take 1 tablet (25 mg total) by mouth daily.   ondansetron  (ZOFRAN  ODT) 4 MG disintegrating tablet Take 1 tablet (4 mg total) by mouth every 8 (eight) hours as needed for nausea or vomiting. (Patient not taking: Reported on 02/05/2024)   No facility-administered medications prior to visit.   "

## 2024-02-10 ENCOUNTER — Encounter

## 2024-02-25 ENCOUNTER — Ambulatory Visit: Payer: Self-pay | Admitting: Psychiatry

## 2024-02-27 ENCOUNTER — Ambulatory Visit: Payer: Self-pay | Admitting: Licensed Clinical Social Worker

## 2024-02-27 NOTE — Patient Instructions (Signed)
 Sleep Hygiene Tips  Bedroom Environment - Keep your bedroom cool, quiet, and dark - Use blackout curtains or a sleep mask - Limit noise with earplugs or a white noise machine - Invest in a comfortable mattress and pillows - Remove electronic devices or dim brightness  Sleep Schedule - Go to bed and wake up at the same time every day, even on weekends - Avoid long naps during the day (limit to 2030 minutes if needed) - Establish a relaxing pre-sleep routine (e.g., reading, gentle stretching)  Diet & Substances - Avoid caffeine, nicotine, and alcohol close to bedtime - Don't go to bed hungry or overly full - Limit fluid intake in the evening to reduce nighttime bathroom trips  Technology & Stimulation - Avoid screens (phones, tablets, TVs) at least 30-60 minutes before bed - Use blue light filters if screen use is unavoidable - Don't work or watch intense/triggering content in bed  Mental & Physical Health - Practice relaxation techniques like deep breathing, meditation, or progressive muscle relaxation - Exercise regularly, but not too close to bedtime - Manage stress and anxiety through journaling, therapy, or mindfulness  When You Cant Sleep - Get out of bed if you can't sleep after 20 minutes - Do something calming in dim light, then return to bed when sleepy - Avoid clock-watching, which can increase anxiety

## 2024-02-27 NOTE — BH Specialist Note (Unsigned)
 Virtual Visit via Video Note  I connected with Debra Wang on 02/27/24 at  2:00 PM EST by a video enabled telemedicine application and verified that I am speaking with the correct person using two identifiers.  Location: Patient: Primary residence, [place] Provider: Clinician virtual office,  [place]   I discussed the limitations of evaluation and management by telemedicine and the availability of in person appointments. The patient expressed understanding and agreed to proceed.   I discussed the assessment and treatment plan with the patient. The patient was provided an opportunity to ask questions and all were answered. The patient agreed with the plan and demonstrated an understanding of the instructions.   Integrated Behavioral Health Follow Up  Visit  02/27/24   MRN: [id number] Name: Debra Wang  Number of Integrated Behavioral Health Clinician visits: 2- Second Visit  Session Start time: 0900   Session End time: 0920  Total time in minutes: 20  No diagnosis found.   Types of Service: Telephone visit and Collaborative care   Subjective: LUN MURO is a 49 y.o. y.o. adult patient with history of depression, anxiety, and insomnia seen in consultation at the request of Janna ostwalt PA C for establishment of [facility] Collaborative Care management. Pt was previously under the care of [facility] in [place].  Pt is currently taking the following psychiatric medications: citalopram  40 mg, risperdal  1mg , topamax  25 mg. Pt reports that she has taken hydroxyzine 25 and trazodone 50 unsuccessfully in the past . Current symptoms include: insomnia most nights (hard to fall asleep, hard to stay asleep), feeling low energy, irritability at times, worrying on some days..  Pt denies SI, HI, or AVH at time of session. Pt denies substance use (smokes cigarettes daily).  Patient reports the following symptoms/concerns: insomnia (hard to fall asleep),   Duration of problem:  ongoing; Severity of problem: moderate  Objective: Mood: {BHH MOOD:22306} and Affect: {BHH AFFECT:22307} Risk of harm to self or others: {CHL AMB BH Suicide Current Mental Status:21022748}  Life Context:  Family and Social:   School/Work: ***  Self-Care: ***  Life Changes: ***  Patient and/or Family's Strengths/Protective Factors: {CHL AMB BH PROTECTIVE [phone]}  Goals Addressed: Patient will:  Reduce symptoms of: {IBH Symptoms:21014056}   Increase knowledge and/or ability of: {IBH Patient Tools:21014057}   Demonstrate ability to: {IBH Goals:21014053}  Progress towards Goals: {CHL AMB BH PROGRESS TOWARDS GOALS:[phone]}  Interventions: Interventions utilized:  {IBH Interventions:21014054} Standardized Assessments completed: {IBH Screening Tools:21014051}      Patient and/or Family Response: ***  Patient Centered Plan: Patient is on the following Treatment Plan(s): ***  Clinical Assessment/Diagnosis  No diagnosis found.    Assessment: Patient currently experiencing ***.   Patient may benefit from ***.  Plan: Follow up with behavioral health clinician on : *** Behavioral recommendations: *** Referral(s): {IBH Referrals:21014055}  Donisha Hoch R Luisangel Wainright, LCSW

## 2024-03-15 ENCOUNTER — Encounter: Payer: Self-pay | Admitting: General Practice

## 2024-03-15 ENCOUNTER — Encounter: Admission: RE | Payer: Self-pay | Source: Home / Self Care

## 2024-03-15 ENCOUNTER — Ambulatory Visit: Admission: RE | Admit: 2024-03-15 | Source: Home / Self Care | Admitting: Gastroenterology

## 2024-04-26 ENCOUNTER — Ambulatory Visit: Admitting: Psychiatry

## 2024-05-06 ENCOUNTER — Ambulatory Visit: Admitting: Physician Assistant
